# Patient Record
Sex: Female | Born: 1945
Health system: Southern US, Community
[De-identification: ages and names within clinical notes are randomized; demographics above are authoritative.]

## PROBLEM LIST (undated history)

## (undated) DIAGNOSIS — I10 Essential (primary) hypertension: Secondary | ICD-10-CM

## (undated) DIAGNOSIS — H409 Unspecified glaucoma: Secondary | ICD-10-CM

## (undated) HISTORY — DX: Unspecified glaucoma: H40.9

## (undated) HISTORY — PX: CATARACT EXTRACTION, BILATERAL: SHX1313

## (undated) HISTORY — PX: EYE SURGERY: SHX253

---

## 2010-06-10 ENCOUNTER — Emergency Department: Payer: Self-pay | Admitting: Emergency Medicine

## 2013-04-01 ENCOUNTER — Ambulatory Visit: Payer: Self-pay | Admitting: Family Medicine

## 2014-10-29 ENCOUNTER — Emergency Department: Payer: Self-pay | Admitting: Emergency Medicine

## 2014-10-29 LAB — CBC WITH DIFFERENTIAL/PLATELET
BASOS ABS: 0 10*3/uL (ref 0.0–0.1)
BASOS PCT: 0.9 %
Eosinophil #: 0.1 10*3/uL (ref 0.0–0.7)
Eosinophil %: 1.4 %
HCT: 39 % (ref 35.0–47.0)
HGB: 13.5 g/dL (ref 12.0–16.0)
Lymphocyte #: 1.6 10*3/uL (ref 1.0–3.6)
Lymphocyte %: 31 %
MCH: 29.2 pg (ref 26.0–34.0)
MCHC: 34.6 g/dL (ref 32.0–36.0)
MCV: 85 fL (ref 80–100)
MONOS PCT: 4.6 %
Monocyte #: 0.2 x10 3/mm (ref 0.2–0.9)
Neutrophil #: 3.1 10*3/uL (ref 1.4–6.5)
Neutrophil %: 62.1 %
PLATELETS: 138 10*3/uL — AB (ref 150–440)
RBC: 4.61 10*6/uL (ref 3.80–5.20)
RDW: 13.7 % (ref 11.5–14.5)
WBC: 5 10*3/uL (ref 3.6–11.0)

## 2014-10-29 LAB — URINALYSIS, COMPLETE
BILIRUBIN, UR: NEGATIVE
Bacteria: NONE SEEN
Blood: NEGATIVE
GLUCOSE, UR: NEGATIVE mg/dL (ref 0–75)
KETONE: NEGATIVE
LEUKOCYTE ESTERASE: NEGATIVE
NITRITE: NEGATIVE
PH: 8 (ref 4.5–8.0)
PROTEIN: NEGATIVE
RBC,UR: 3 /HPF (ref 0–5)
SPECIFIC GRAVITY: 1.014 (ref 1.003–1.030)
Squamous Epithelial: 1
WBC UR: 1 /HPF (ref 0–5)

## 2014-10-29 LAB — BASIC METABOLIC PANEL
ANION GAP: 7 (ref 7–16)
BUN: 12 mg/dL (ref 7–18)
CALCIUM: 8.4 mg/dL — AB (ref 8.5–10.1)
Chloride: 108 mmol/L — ABNORMAL HIGH (ref 98–107)
Co2: 27 mmol/L (ref 21–32)
Creatinine: 0.77 mg/dL (ref 0.60–1.30)
EGFR (African American): >60
GLUCOSE: 121 mg/dL — AB (ref 65–99)
Osmolality: 284 (ref 275–301)
Potassium: 3.9 mmol/L (ref 3.5–5.1)
SODIUM: 142 mmol/L (ref 136–145)

## 2014-10-29 LAB — TROPONIN I

## 2014-11-04 ENCOUNTER — Ambulatory Visit: Payer: Self-pay

## 2015-04-14 ENCOUNTER — Encounter: Payer: Self-pay | Admitting: Family Medicine

## 2015-04-14 DIAGNOSIS — R7303 Prediabetes: Secondary | ICD-10-CM | POA: Insufficient documentation

## 2015-04-14 DIAGNOSIS — E669 Obesity, unspecified: Secondary | ICD-10-CM | POA: Insufficient documentation

## 2015-04-14 DIAGNOSIS — I10 Essential (primary) hypertension: Secondary | ICD-10-CM | POA: Insufficient documentation

## 2015-11-30 ENCOUNTER — Other Ambulatory Visit: Payer: Self-pay | Admitting: Family Medicine

## 2015-11-30 MED ORDER — LISINOPRIL 5 MG PO TABS: 5.0000 mg | ORAL_TABLET | Freq: Every day | ORAL | Status: DC

## 2016-04-04 DIAGNOSIS — H25813 Combined forms of age-related cataract, bilateral: Secondary | ICD-10-CM | POA: Diagnosis not present

## 2016-07-26 DIAGNOSIS — H25813 Combined forms of age-related cataract, bilateral: Secondary | ICD-10-CM | POA: Diagnosis not present

## 2016-07-26 DIAGNOSIS — H401131 Primary open-angle glaucoma, bilateral, mild stage: Secondary | ICD-10-CM | POA: Diagnosis not present

## 2016-08-06 ENCOUNTER — Emergency Department
Admission: EM | Admit: 2016-08-06 | Discharge: 2016-08-06 | Disposition: A | Discharge status: Home / Self Care | Payer: BLUE CROSS/BLUE SHIELD | Attending: Emergency Medicine | Admitting: Emergency Medicine

## 2016-08-06 ENCOUNTER — Encounter: Payer: Self-pay | Admitting: Medical Oncology

## 2016-08-06 ENCOUNTER — Emergency Department: Payer: BLUE CROSS/BLUE SHIELD

## 2016-08-06 DIAGNOSIS — R42 Dizziness and giddiness: Secondary | ICD-10-CM | POA: Diagnosis present

## 2016-08-06 DIAGNOSIS — I10 Essential (primary) hypertension: Secondary | ICD-10-CM | POA: Diagnosis not present

## 2016-08-06 DIAGNOSIS — R55 Syncope and collapse: Secondary | ICD-10-CM

## 2016-08-06 HISTORY — DX: Essential (primary) hypertension: I10

## 2016-08-06 LAB — URINALYSIS COMPLETE WITH MICROSCOPIC (ARMC ONLY)
BILIRUBIN URINE: NEGATIVE
Glucose, UA: NEGATIVE mg/dL
HGB URINE DIPSTICK: NEGATIVE
Ketones, ur: NEGATIVE mg/dL
NITRITE: NEGATIVE
PH: 7 (ref 5.0–8.0)
Protein, ur: NEGATIVE mg/dL
RBC / HPF: NONE SEEN RBC/hpf (ref 0–5)
Specific Gravity, Urine: 1.01 (ref 1.005–1.030)

## 2016-08-06 LAB — CBC
HCT: 37.3 % (ref 35.0–47.0)
Hemoglobin: 13.2 g/dL (ref 12.0–16.0)
MCH: 29.4 pg (ref 26.0–34.0)
MCHC: 35.5 g/dL (ref 32.0–36.0)
MCV: 82.7 fL (ref 80.0–100.0)
Platelets: 137 10*3/uL — ABNORMAL LOW (ref 150–440)
RBC: 4.51 MIL/uL (ref 3.80–5.20)
RDW: 13.8 % (ref 11.5–14.5)
WBC: 5.1 10*3/uL (ref 3.6–11.0)

## 2016-08-06 LAB — BASIC METABOLIC PANEL
Anion gap: 7 (ref 5–15)
BUN: 17 mg/dL (ref 6–20)
CALCIUM: 9.3 mg/dL (ref 8.9–10.3)
CHLORIDE: 105 mmol/L (ref 101–111)
CO2: 27 mmol/L (ref 22–32)
CREATININE: 0.73 mg/dL (ref 0.44–1.00)
GFR calc Af Amer: >60 mL/min (ref >60)
GFR calc non Af Amer: >60 mL/min (ref >60)
GLUCOSE: 91 mg/dL (ref 65–99)
Potassium: 3.6 mmol/L (ref 3.5–5.1)
Sodium: 139 mmol/L (ref 135–145)

## 2016-08-06 LAB — TROPONIN I: Troponin I: <0.03 ng/mL (ref <0.03)

## 2016-08-06 NOTE — ED Notes (Signed)
Called lab to check on troponin results.  Lab informed me that they did not realize there was an add-on lab for this patient but they are pulling it now to run it.

## 2016-08-06 NOTE — ED Triage Notes (Addendum)
Pt reports she has been feeling dizzy since this am, pt reports she worked today until 1600. Around 1720 she was driving when she began having blurred vision and felt like her BP was elevated. Pt denies pain.  1810: while sitting in triage room pt began having some left hand numbness which immediately resolved. Orders placed for CT.

## 2016-08-06 NOTE — ED Provider Notes (Signed)
Time Seen: Approximately 1845  I have reviewed the triage notes  Chief Complaint: Hypertension   History of Present Illness: Catherine Pena is a 70 y.o. female *who states that she was talking to one driver and was driving and had sudden onset of feeling lightheaded dizziness. She describes some blurred vision without any visual field deficits. Patient felt that her blood pressure may be elevated and does have a history of hypertension. She states she feels improved at this point denies any chest pain, shortness of breath, trouble with speech or swallowing, bad headache, carbon monoxide exposure, or any other symptoms at this time. She had some brief episode of some numbness in her left hand in the triage. No weakness   Past Medical History:  Diagnosis Date  . Hypertension     Patient Active Problem List   Diagnosis Date Noted  . Benign essential HTN 04/14/2015  . Adiposity 04/14/2015  . Borderline diabetes 04/14/2015    History reviewed. No pertinent surgical history.  History reviewed. No pertinent surgical history.  Current Outpatient Rx  . Order #: [id number] Class: Normal    Allergies:  Review of patient's allergies indicates no known allergies.  Family History: No family history on file.  Social History: Social History  Substance Use Topics  . Smoking status: Not on file  . Smokeless tobacco: Not on file  . Alcohol use Not on file     Review of Systems:   10 point review of systems was performed and was otherwise negative:  Constitutional: No fever Eyes: No visual disturbances ENT: No sore throat, ear pain Cardiac: No chest pain Respiratory: No shortness of breath, wheezing, or stridor Abdomen: No abdominal pain, no vomiting, No diarrhea Endocrine: No weight loss, No night sweats Extremities: No peripheral edema, cyanosis Skin: No rashes, easy bruising Neurologic: No focal weakness, trouble with speech or swollowing Urologic: No dysuria, Hematuria,  or urinary frequency   Physical Exam:  ED Triage Vitals [08/06/16 1803]  Enc Vitals Group     BP (!) 163/84     Pulse Rate 62     Resp 17     Temp 98.1 F (36.7 C)     Temp Source Oral     SpO2 97 %     Weight 174 lb (78.9 kg)     Height 5\' 5"  (1.651 m)     Head Circumference      Peak Flow      Pain Score      Pain Loc      Pain Edu?      Excl. in GC?     General: Awake , Alert , and Oriented times 3; GCS 15 Head: Normal cephalic , atraumatic Eyes: Pupils equal , round, reactive to light Nose/Throat: No nasal drainage, patent upper airway without erythema or exudate.  Neck: Supple, Full range of motion, No anterior adenopathy or palpable thyroid masses Lungs: Clear to ascultation without wheezes , rhonchi, or rales Heart: Regular rate, regular rhythm without murmurs , gallops , or rubs Abdomen: Soft, non tender without rebound, guarding , or rigidity; bowel sounds positive and symmetric in all 4 quadrants. No organomegaly .        Extremities: 2 plus symmetric pulses. No edema, clubbing or cyanosis Neurologic: normal ambulation, Motor symmetric without deficits, sensory intact Skin: warm, dry, no rashes   Labs:   All laboratory work was reviewed including any pertinent negatives or positives listed below:  Labs Reviewed  CBC - Abnormal; Notable  for the following:       Result Value   Platelets 137 (*)    All other components within normal limits  URINALYSIS COMPLETEWITH MICROSCOPIC ([facility] ONLY) - Abnormal; Notable for the following:    Color, Urine STRAW (*)    APPearance CLEAR (*)    Leukocytes, UA TRACE (*)    Bacteria, UA RARE (*)    Squamous Epithelial / LPF 0-5 (*)    All other components within normal limits  BASIC METABOLIC PANEL  TROPONIN I  CBG MONITORING, ED  Laboratory work was reviewed and showed no clinically significant abnormalities.   EKG: ED ECG REPORT I, Jennye MoccasinBrian S Quigley, the attending physician, personally viewed and interpreted this  ECG.  Date: 08/06/2016 EKG Time: 1810 Rate: *58 Rhythm: normal sinus rhythm QRS Axis: normal Intervals: normal ST/T Wave abnormalities: Nonspecific ST and T wave abnormality Conduction Disturbances: none Narrative Interpretation: unremarkable No acute ischemic changes   Radiology: "Ct Head Wo Contrast  Result Date: 08/06/2016 CLINICAL DATA:  Acute onset of near-syncope. Numbness and tingling at the left arm. Initial encounter. EXAM: CT HEAD WITHOUT CONTRAST TECHNIQUE: Contiguous axial images were obtained from the base of the skull through the vertex without intravenous contrast. COMPARISON:  None. FINDINGS: There is no evidence of acute infarction, mass lesion, or intra- or extra-axial hemorrhage on CT. The posterior fossa, including the cerebellum, brainstem and fourth ventricle, is within normal limits. The third and lateral ventricles, and basal ganglia are unremarkable in appearance. The cerebral hemispheres are symmetric in appearance, with normal gray-white differentiation. No mass effect or midline shift is seen. There is no evidence of fracture; visualized osseous structures are unremarkable in appearance. The visualized portions of the orbits are within normal limits. The paranasal sinuses and mastoid air cells are well-aerated. No significant soft tissue abnormalities are seen. IMPRESSION: Unremarkable noncontrast CT of the head. Electronically Signed   By: Roanna RaiderJeffery  Chang M.D.   On: 08/06/2016 19:06  "  I personally reviewed the radiologic studies    ED Course:  Patient's blood pressure is elevated but not at a critical level of felt that was causing her significant symptoms at this time. The patient had a near syncopal episode but did not have any loss of consciousness and did not seem to have any chest pain, pulmonary emboli risk factors, etc. Given her negative neurologic exam and a normal head CT I felt this was unlikely to be a transient ischemic attack   Clinical Course      Assessment:  Near syncope   Final Clinical Impression: *  Final diagnoses:  Near syncope     Plan: * Outpatient Patient was advised to return immediately if condition worsens. Patient was advised to follow up with their primary care physician or other specialized physicians involved in their outpatient care. The patient and/or family member/power of attorney had laboratory results reviewed at the bedside. All questions and concerns were addressed and appropriate discharge instructions were distributed by the nursing staff.            Jennye MoccasinBrian S Quigley, MD 08/06/16 [phone]

## 2016-08-09 ENCOUNTER — Encounter: Payer: Self-pay | Admitting: Family Medicine

## 2016-08-09 ENCOUNTER — Ambulatory Visit (INDEPENDENT_AMBULATORY_CARE_PROVIDER_SITE_OTHER): Payer: BLUE CROSS/BLUE SHIELD | Admitting: Family Medicine

## 2016-08-09 VITALS — BP 122/82 | HR 78 | Ht 65.0 in | Wt 175.0 lb

## 2016-08-09 DIAGNOSIS — I10 Essential (primary) hypertension: Secondary | ICD-10-CM | POA: Diagnosis not present

## 2016-08-09 MED ORDER — LISINOPRIL 5 MG PO TABS
5.0000 mg | ORAL_TABLET | Freq: Every day | ORAL | 2 refills | Status: DC
Start: 2016-08-09 — End: 2017-09-03

## 2016-08-09 NOTE — Progress Notes (Signed)
Name: Darin EngelsJanice M Pena   MRN: [id number]    DOB: [birth year]   Date:08/09/2016       Progress Note  Subjective  Chief Complaint  Chief Complaint  Patient presents with  . Hypertension    yesterday it was 140/90    Hypertension  This is a chronic problem. The current episode started more than 1 year ago. The problem has been waxing and waning since onset. The problem is controlled. Pertinent negatives include no anxiety, blurred vision, chest pain, headaches, malaise/fatigue, neck pain, orthopnea, palpitations, peripheral edema, PND, shortness of breath or sweats. There are no associated agents to hypertension. There are no known risk factors for coronary artery disease. Past treatments include ACE inhibitors. The current treatment provides mild improvement. There are no compliance problems.  There is no history of angina, kidney disease, CAD/MI, CVA, heart failure, left ventricular hypertrophy, PVD, renovascular disease or retinopathy. There is no history of chronic renal disease or a hypertension causing med.    No problem-specific Assessment & Plan notes found for this encounter.   Past Medical History:  Diagnosis Date  . Hypertension     No past surgical history on file.  No family history on file.  Social History   Social History  . Marital status: Single    Spouse name: N/A  . Number of children: N/A  . Years of education: N/A   Occupational History  . Not on file.   Social History Main Topics  . Smoking status: Never Smoker  . Smokeless tobacco: Never Used  . Alcohol use Not on file  . Drug use: No  . Sexual activity: Not Currently   Other Topics Concern  . Not on file   Social History Narrative  . No narrative on file    No Known Allergies   Review of Systems  Constitutional: Negative for chills, fever, malaise/fatigue and weight loss.  HENT: Negative for ear discharge, ear pain and sore throat.   Eyes: Negative for blurred vision.  Respiratory: Negative  for cough, sputum production, shortness of breath and wheezing.   Cardiovascular: Negative for chest pain, palpitations, orthopnea, leg swelling and PND.  Gastrointestinal: Negative for abdominal pain, blood in stool, constipation, diarrhea, heartburn, melena and nausea.  Genitourinary: Negative for dysuria, frequency, hematuria and urgency.  Musculoskeletal: Negative for back pain, joint pain, myalgias and neck pain.  Skin: Negative for rash.  Neurological: Negative for dizziness, tingling, sensory change, focal weakness and headaches.  Endo/Heme/Allergies: Negative for environmental allergies and polydipsia. Does not bruise/bleed easily.  Psychiatric/Behavioral: Negative for depression and suicidal ideas. The patient is not nervous/anxious and does not have insomnia.      Objective  Vitals:   08/09/16 1438  BP: 122/82  Pulse: 78  Weight: 175 lb (79.4 kg)  Height: 5\' 5"  (1.651 m)    Physical Exam  Constitutional: She is well-developed, well-nourished, and in no distress. No distress.  HENT:  Head: Normocephalic and atraumatic.  Right Ear: External ear normal.  Left Ear: External ear normal.  Nose: Nose normal.  Mouth/Throat: Oropharynx is clear and moist.  Eyes: Conjunctivae and EOM are normal. Pupils are equal, round, and reactive to light. Right eye exhibits no discharge. Left eye exhibits no discharge.  Neck: Normal range of motion. Neck supple. No JVD present. No thyromegaly present.  Cardiovascular: Normal rate, regular rhythm, normal heart sounds and intact distal pulses.  Exam reveals no gallop and no friction rub.   No murmur heard. Pulmonary/Chest: Effort normal and breath  sounds normal.  Abdominal: Soft. Bowel sounds are normal. She exhibits no mass. There is no tenderness. There is no guarding.  Musculoskeletal: Normal range of motion. She exhibits no edema.  Lymphadenopathy:    She has no cervical adenopathy.  Neurological: She is alert. She has normal reflexes.   Skin: Skin is warm and dry. No rash noted. She is not diaphoretic. No erythema.  Psychiatric: Mood and affect normal.  Nursing note and vitals reviewed.     Assessment & Plan  Problem List Items Addressed This Visit      Cardiovascular and Mediastinum   Benign essential HTN - Primary   Relevant Medications   lisinopril (PRINIVIL,ZESTRIL) 5 MG tablet   Other Relevant Orders   Renal Function Panel    Other Visit Diagnoses   None.       Dr. Hayden Rasmusseneanna Jones [facility]  08/09/16

## 2016-08-10 LAB — RENAL FUNCTION PANEL
Albumin: 4.5 g/dL (ref 3.6–4.8)
BUN / CREAT RATIO: 16 (ref 12–28)
BUN: 13 mg/dL (ref 8–27)
CHLORIDE: 103 mmol/L (ref 96–106)
CO2: 24 mmol/L (ref 18–29)
Calcium: 9.9 mg/dL (ref 8.7–10.3)
Creatinine, Ser: 0.82 mg/dL (ref 0.57–1.00)
GFR calc non Af Amer: 74 mL/min/{1.73_m2} (ref >59)
GFR, EST AFRICAN AMERICAN: 85 mL/min/{1.73_m2} (ref >59)
GLUCOSE: 86 mg/dL (ref 65–99)
Phosphorus: 4 mg/dL (ref 2.5–4.5)
Potassium: 4.3 mmol/L (ref 3.5–5.2)
Sodium: 143 mmol/L (ref 134–144)

## 2016-10-05 DIAGNOSIS — H18413 Arcus senilis, bilateral: Secondary | ICD-10-CM | POA: Diagnosis not present

## 2016-10-05 DIAGNOSIS — H02839 Dermatochalasis of unspecified eye, unspecified eyelid: Secondary | ICD-10-CM | POA: Diagnosis not present

## 2016-10-05 DIAGNOSIS — H2511 Age-related nuclear cataract, right eye: Secondary | ICD-10-CM | POA: Diagnosis not present

## 2016-10-05 DIAGNOSIS — H2513 Age-related nuclear cataract, bilateral: Secondary | ICD-10-CM | POA: Diagnosis not present

## 2016-10-25 DIAGNOSIS — H25813 Combined forms of age-related cataract, bilateral: Secondary | ICD-10-CM | POA: Diagnosis not present

## 2016-10-25 DIAGNOSIS — H401131 Primary open-angle glaucoma, bilateral, mild stage: Secondary | ICD-10-CM | POA: Diagnosis not present

## 2016-11-05 DIAGNOSIS — H2511 Age-related nuclear cataract, right eye: Secondary | ICD-10-CM | POA: Diagnosis not present

## 2016-11-06 DIAGNOSIS — H2512 Age-related nuclear cataract, left eye: Secondary | ICD-10-CM | POA: Diagnosis not present

## 2016-11-13 DIAGNOSIS — H5211 Myopia, right eye: Secondary | ICD-10-CM | POA: Diagnosis not present

## 2016-11-26 DIAGNOSIS — H401131 Primary open-angle glaucoma, bilateral, mild stage: Secondary | ICD-10-CM | POA: Diagnosis not present

## 2016-11-26 DIAGNOSIS — H2512 Age-related nuclear cataract, left eye: Secondary | ICD-10-CM | POA: Diagnosis not present

## 2016-11-26 DIAGNOSIS — H25012 Cortical age-related cataract, left eye: Secondary | ICD-10-CM | POA: Diagnosis not present

## 2017-05-06 DIAGNOSIS — H401131 Primary open-angle glaucoma, bilateral, mild stage: Secondary | ICD-10-CM | POA: Diagnosis not present

## 2017-05-06 DIAGNOSIS — Z961 Presence of intraocular lens: Secondary | ICD-10-CM | POA: Diagnosis not present

## 2017-05-06 DIAGNOSIS — H47233 Glaucomatous optic atrophy, bilateral: Secondary | ICD-10-CM | POA: Diagnosis not present

## 2017-08-07 DIAGNOSIS — H401131 Primary open-angle glaucoma, bilateral, mild stage: Secondary | ICD-10-CM | POA: Diagnosis not present

## 2017-09-03 ENCOUNTER — Other Ambulatory Visit: Payer: Self-pay | Admitting: Family Medicine

## 2017-09-14 ENCOUNTER — Other Ambulatory Visit: Payer: Self-pay | Admitting: Family Medicine

## 2017-10-01 ENCOUNTER — Other Ambulatory Visit: Payer: Self-pay | Admitting: Family Medicine

## 2017-10-24 ENCOUNTER — Ambulatory Visit (INDEPENDENT_AMBULATORY_CARE_PROVIDER_SITE_OTHER): Payer: BLUE CROSS/BLUE SHIELD | Admitting: Family Medicine

## 2017-10-24 ENCOUNTER — Encounter: Payer: Self-pay | Admitting: Family Medicine

## 2017-10-24 VITALS — BP 120/88 | HR 64 | Ht 65.0 in | Wt 186.0 lb

## 2017-10-24 DIAGNOSIS — E6609 Other obesity due to excess calories: Secondary | ICD-10-CM | POA: Diagnosis not present

## 2017-10-24 DIAGNOSIS — Z6830 Body mass index (BMI) 30.0-30.9, adult: Secondary | ICD-10-CM

## 2017-10-24 DIAGNOSIS — I10 Essential (primary) hypertension: Secondary | ICD-10-CM

## 2017-10-24 MED ORDER — LISINOPRIL 5 MG PO TABS: ORAL_TABLET | ORAL | 2 refills | Status: DC

## 2017-10-24 NOTE — Progress Notes (Signed)
Name: Catherine EngelsJanice M Pena   MRN: [id number]    DOB: [birth year]   Date:10/24/2017       Progress Note  Subjective  Chief Complaint  Chief Complaint  Patient presents with  . Hypertension    Hypertension  This is a chronic problem. The current episode started more than 1 year ago. The problem is unchanged. The problem is controlled. Pertinent negatives include no anxiety, blurred vision, chest pain, headaches, malaise/fatigue, neck pain, orthopnea, palpitations, peripheral edema, PND, shortness of breath or sweats. There are no associated agents to hypertension. Past treatments include ACE inhibitors. The current treatment provides moderate improvement. There are no compliance problems.  There is no history of angina, kidney disease, CAD/MI, CVA, heart failure, left ventricular hypertrophy, PVD or retinopathy. There is no history of chronic renal disease, a hypertension causing med or renovascular disease.    No problem-specific Assessment & Plan notes found for this encounter.   Past Medical History:  Diagnosis Date  . Hypertension     No past surgical history on file.  No family history on file.  Social History   Social History  . Marital status: Single    Spouse name: N/A  . Number of children: N/A  . Years of education: N/A   Occupational History  . Not on file.   Social History Main Topics  . Smoking status: Never Smoker  . Smokeless tobacco: Never Used  . Alcohol use Not on file  . Drug use: No  . Sexual activity: Not Currently   Other Topics Concern  . Not on file   Social History Narrative  . No narrative on file    No Known Allergies  Outpatient Medications Prior to Visit  Medication Sig Dispense Refill  . lisinopril (PRINIVIL,ZESTRIL) 5 MG tablet TAKE 1 TABLET BY MOUTH EVERY DAY *NEED APPT* 3 tablet 0   No facility-administered medications prior to visit.     Review of Systems  Constitutional: Negative for chills, fever, malaise/fatigue and weight loss.   HENT: Negative for ear discharge, ear pain and sore throat.   Eyes: Negative for blurred vision.  Respiratory: Negative for cough, sputum production, shortness of breath and wheezing.   Cardiovascular: Negative for chest pain, palpitations, orthopnea, leg swelling and PND.  Gastrointestinal: Negative for abdominal pain, blood in stool, constipation, diarrhea, heartburn, melena and nausea.  Genitourinary: Negative for dysuria, frequency, hematuria and urgency.  Musculoskeletal: Negative for back pain, joint pain, myalgias and neck pain.  Skin: Negative for rash.  Neurological: Negative for dizziness, tingling, sensory change, focal weakness and headaches.  Endo/Heme/Allergies: Negative for environmental allergies and polydipsia. Does not bruise/bleed easily.  Psychiatric/Behavioral: Negative for depression and suicidal ideas. The patient is not nervous/anxious and does not have insomnia.      Objective  Vitals:   10/24/17 1057  BP: 120/88  Pulse: 64  Weight: 186 lb (84.4 kg)  Height: 5\' 5"  (1.651 m)    Physical Exam  Constitutional: She is well-developed, well-nourished, and in no distress. No distress.  HENT:  Head: Normocephalic and atraumatic.  Right Ear: External ear normal.  Left Ear: External ear normal.  Nose: Nose normal.  Mouth/Throat: Oropharynx is clear and moist.  Eyes: Pupils are equal, round, and reactive to light. Conjunctivae and EOM are normal. Right eye exhibits no discharge. Left eye exhibits no discharge.  Neck: Normal range of motion. Neck supple. No JVD present. No thyromegaly present.  Cardiovascular: Normal rate, regular rhythm, normal heart sounds and intact distal pulses.  Exam reveals no gallop and no friction rub.   No murmur heard. Pulmonary/Chest: Effort normal and breath sounds normal.  Abdominal: Soft. Bowel sounds are normal. She exhibits no mass. There is no tenderness. There is no guarding.  Musculoskeletal: Normal range of motion. She  exhibits no edema.  Lymphadenopathy:    She has no cervical adenopathy.  Neurological: She is alert.  Skin: Skin is warm and dry. She is not diaphoretic.  Psychiatric: Mood and affect normal.  Nursing note and vitals reviewed.     Assessment & Plan  Problem List Items Addressed This Visit      Cardiovascular and Mediastinum   Benign essential HTN - Primary   Relevant Medications   lisinopril (PRINIVIL,ZESTRIL) 5 MG tablet   Other Relevant Orders   Renal Function Panel     Other   Adiposity   Relevant Orders   Lipid Profile      Meds ordered this encounter  Medications  . lisinopril (PRINIVIL,ZESTRIL) 5 MG tablet    Sig: TAKE 1 TABLET BY MOUTH EVERY DAY *NEED APPT*    Dispense:  90 tablet    Refill:  2      Dr. Hayden [facility]  10/24/17

## 2017-10-24 NOTE — Patient Instructions (Signed)

## 2017-10-25 LAB — RENAL FUNCTION PANEL
ALBUMIN: 4.3 g/dL (ref 3.6–4.8)
BUN/Creatinine Ratio: 17 (ref 12–28)
BUN: 13 mg/dL (ref 8–27)
CALCIUM: 9.4 mg/dL (ref 8.7–10.3)
CHLORIDE: 101 mmol/L (ref 96–106)
CO2: 27 mmol/L (ref 20–29)
Creatinine, Ser: 0.76 mg/dL (ref 0.57–1.00)
GFR calc Af Amer: 93 mL/min/{1.73_m2} (ref >59)
GFR calc non Af Amer: 80 mL/min/{1.73_m2} (ref >59)
GLUCOSE: 85 mg/dL (ref 65–99)
PHOSPHORUS: 3.4 mg/dL (ref 2.5–4.5)
POTASSIUM: 4.5 mmol/L (ref 3.5–5.2)
SODIUM: 141 mmol/L (ref 134–144)

## 2017-10-25 LAB — LIPID PANEL
CHOL/HDL RATIO: 3.2 ratio (ref 0.0–4.4)
CHOLESTEROL TOTAL: 167 mg/dL (ref 100–199)
HDL: 52 mg/dL (ref >39)
LDL Calculated: 98 mg/dL (ref 0–99)
TRIGLYCERIDES: 83 mg/dL (ref 0–149)
VLDL Cholesterol Cal: 17 mg/dL (ref 5–40)

## 2017-11-07 ENCOUNTER — Ambulatory Visit (INDEPENDENT_AMBULATORY_CARE_PROVIDER_SITE_OTHER): Payer: BLUE CROSS/BLUE SHIELD

## 2017-11-07 VITALS — BP 142/70 | HR 77 | Temp 97.6°F | Resp 16 | Ht 65.0 in | Wt 185.6 lb

## 2017-11-07 DIAGNOSIS — Z1231 Encounter for screening mammogram for malignant neoplasm of breast: Secondary | ICD-10-CM

## 2017-11-07 DIAGNOSIS — E2839 Other primary ovarian failure: Secondary | ICD-10-CM

## 2017-11-07 DIAGNOSIS — Z1159 Encounter for screening for other viral diseases: Secondary | ICD-10-CM | POA: Diagnosis not present

## 2017-11-07 DIAGNOSIS — Z1239 Encounter for other screening for malignant neoplasm of breast: Secondary | ICD-10-CM

## 2017-11-07 DIAGNOSIS — Z0000 Encounter for general adult medical examination without abnormal findings: Secondary | ICD-10-CM | POA: Diagnosis not present

## 2017-11-07 NOTE — Patient Instructions (Signed)
[name] , Thank you for taking time to come for your Medicare Wellness Visit. I appreciate your ongoing commitment to your health goals. Please review the following plan we discussed and let me know if I can assist you in the future.   Screening recommendations/referrals: Colon Cancer Screening: Declined. Will discuss with Dr. Yetta BarreJones next year Mammogram: Ordered today. Please call [phone] to schedule your mammogram.  Bone Density: Ordered today. Our office will call you with your appointment date/time Recommended yearly ophthalmology/optometry visit for glaucoma screening and checkup Recommended yearly dental visit for hygiene and checkup  Vaccinations: Influenza vaccine: Up to date Pneumococcal vaccine: Declined. Please call your insurance company to determine your out of pocket expense. Tdap vaccine: Declined. Please call your insurance company to determine your out of pocket expense. Shingles vaccine: Declined. Please call your insurance company to determine your out of pocket expense.    Advanced directives: Advance directive discussed with you today. I have provided a copy for you to complete at home and have notarized. Once this is complete please bring a copy in to our office so we can scan it into your chart.  Conditions/risks identified: Try to increase exercise to 3 days per week, 45 minutes OR exercise 5 times per week for 30 minutes  Next appointment: Please schedule a follow up appointment with Dr. Yetta BarreJones   Please schedule your annual wellness exam with your Nurse Health Advisor in one year.  Preventive Care 1665 Years and Older, Female Preventive care refers to lifestyle choices and visits with your health care provider that can promote health and wellness. What does preventive care include?  A yearly physical exam. This is also called an annual well check.  Dental exams once or twice a year.  Routine eye exams. Ask your health care provider how often you should have  your eyes checked.  Personal lifestyle choices, including:  Daily care of your teeth and gums.  Regular physical activity.  Eating a healthy diet.  Avoiding tobacco and drug use.  Limiting alcohol use.  Practicing safe sex.  Taking low-dose aspirin every day.  Taking vitamin and mineral supplements as recommended by your health care provider. What happens during an annual well check? The services and screenings done by your health care provider during your annual well check will depend on your age, overall health, lifestyle risk factors, and family history of disease. Counseling  Your health care provider may ask you questions about your:  Alcohol use.  Tobacco use.  Drug use.  Emotional well-being.  Home and relationship well-being.  Sexual activity.  Eating habits.  History of falls.  Memory and ability to understand (cognition).  Work and work Astronomerenvironment.  Reproductive health. Screening  You may have the following tests or measurements:  Height, weight, and BMI.  Blood pressure.  Lipid and cholesterol levels. These may be checked every 5 years, or more frequently if you are over 69 years old.  Skin check.  Lung cancer screening. You may have this screening every year starting at age 69 if you have a 30-pack-year history of smoking and currently smoke or have quit within the past 15 years.  Fecal occult blood test (FOBT) of the stool. You may have this test every year starting at age 69.  Flexible sigmoidoscopy or colonoscopy. You may have a sigmoidoscopy every 5 years or a colonoscopy every 10 years starting at age 69.  Hepatitis C blood test.  Hepatitis B blood test.  Sexually transmitted disease (STD) testing.  Diabetes screening. This is done by checking your blood sugar (glucose) after you have not eaten for a while (fasting). You may have this done every 1-3 years.  Bone density scan. This is done to screen for osteoporosis. You may have  this done starting at age 87.  Mammogram. This may be done every 1-2 years. Talk to your health care provider about how often you should have regular mammograms. Talk with your health care provider about your test results, treatment options, and if necessary, the need for more tests. Vaccines  Your health care provider may recommend certain vaccines, such as:  Influenza vaccine. This is recommended every year.  Tetanus, diphtheria, and acellular pertussis (Tdap, Td) vaccine. You may need a Td booster every 10 years.  Zoster vaccine. You may need this after age 38.  Pneumococcal 13-valent conjugate (PCV13) vaccine. One dose is recommended after age 34.  Pneumococcal polysaccharide (PPSV23) vaccine. One dose is recommended after age 32. Talk to your health care provider about which screenings and vaccines you need and how often you need them. This information is not intended to replace advice given to you by your health care provider. Make sure you discuss any questions you have with your health care provider. Document Released: 01/06/2016 Document Revised: 08/29/2016 Document Reviewed: 10/11/2015 Elsevier Interactive Patient Education  2017 [place] Prevention in the Home Falls can cause injuries. They can happen to people of all ages. There are many things you can do to make your home safe and to help prevent falls. What can I do on the outside of my home?  Regularly fix the edges of walkways and driveways and fix any cracks.  Remove anything that might make you trip as you walk through a door, such as a raised step or threshold.  Trim any bushes or trees on the path to your home.  Use bright outdoor lighting.  Clear any walking paths of anything that might make someone trip, such as rocks or tools.  Regularly check to see if handrails are loose or broken. Make sure that both sides of any steps have handrails.  Any raised decks and porches should have guardrails on  the edges.  Have any leaves, snow, or ice cleared regularly.  Use sand or salt on walking paths during winter.  Clean up any spills in your garage right away. This includes oil or grease spills. What can I do in the bathroom?  Use night lights.  Install grab bars by the toilet and in the tub and shower. Do not use towel bars as grab bars.  Use non-skid mats or decals in the tub or shower.  If you need to sit down in the shower, use a plastic, non-slip stool.  Keep the floor dry. Clean up any water that spills on the floor as soon as it happens.  Remove soap buildup in the tub or shower regularly.  Attach bath mats securely with double-sided non-slip rug tape.  Do not have throw rugs and other things on the floor that can make you trip. What can I do in the bedroom?  Use night lights.  Make sure that you have a light by your bed that is easy to reach.  Do not use any sheets or blankets that are too big for your bed. They should not hang down onto the floor.  Have a firm chair that has side arms. You can use this for support while you get dressed.  Do not have throw  rugs and other things on the floor that can make you trip. What can I do in the kitchen?  Clean up any spills right away.  Avoid walking on wet floors.  Keep items that you use a lot in easy-to-reach places.  If you need to reach something above you, use a strong step stool that has a grab bar.  Keep electrical cords out of the way.  Do not use floor polish or wax that makes floors slippery. If you must use wax, use non-skid floor wax.  Do not have throw rugs and other things on the floor that can make you trip. What can I do with my stairs?  Do not leave any items on the stairs.  Make sure that there are handrails on both sides of the stairs and use them. Fix handrails that are broken or loose. Make sure that handrails are as long as the stairways.  Check any carpeting to make sure that it is firmly  attached to the stairs. Fix any carpet that is loose or worn.  Avoid having throw rugs at the top or bottom of the stairs. If you do have throw rugs, attach them to the floor with carpet tape.  Make sure that you have a light switch at the top of the stairs and the bottom of the stairs. If you do not have them, ask someone to add them for you. What else can I do to help prevent falls?  Wear shoes that:  Do not have high heels.  Have rubber bottoms.  Are comfortable and fit you well.  Are closed at the toe. Do not wear sandals.  If you use a stepladder:  Make sure that it is fully opened. Do not climb a closed stepladder.  Make sure that both sides of the stepladder are locked into place.  Ask someone to hold it for you, if possible.  Clearly mark and make sure that you can see:  Any grab bars or handrails.  First and last steps.  Where the edge of each step is.  Use tools that help you move around (mobility aids) if they are needed. These include:  Canes.  Walkers.  Scooters.  Crutches.  Turn on the lights when you go into a dark area. Replace any light bulbs as soon as they burn out.  Set up your furniture so you have a clear path. Avoid moving your furniture around.  If any of your floors are uneven, fix them.  If there are any pets around you, be aware of where they are.  Review your medicines with your doctor. Some medicines can make you feel dizzy. This can increase your chance of falling. Ask your doctor what other things that you can do to help prevent falls. This information is not intended to replace advice given to you by your health care provider. Make sure you discuss any questions you have with your health care provider. Document Released: 10/06/2009 Document Revised: 05/17/2016 Document Reviewed: 01/14/2015 Elsevier Interactive Patient Education  2017 [employer]

## 2017-11-07 NOTE — Progress Notes (Signed)
Subjective:   Catherine EngelsJanice M Pena is a 71 y.o. female who presents for Medicare Annual (Subsequent) preventive examination.  Review of Systems:  N/A Cardiac Risk Factors include: advanced age (>2155men, 21>65 women);hypertension;obesity (BMI >30kg/m2)     Objective:     Vitals: BP (!) 142/70 (BP Location: Right Arm, Patient Position: Sitting, Cuff Size: Large)   Pulse 77   Temp 97.6 F (36.4 C) (Oral)   Resp 16   Ht 5\' 5"  (1.651 m)   Wt 185 lb 9.6 oz (84.2 kg)   BMI 30.89 kg/m   Body mass index is 30.89 kg/m.   Tobacco Social History   Tobacco Use  Smoking Status Former Smoker  . Packs/day: 0.25  . Years: 20.00  . Pack years: 5.00  . Types: Cigarettes  Smokeless Tobacco Never Used     Counseling given: No   Past Medical History:  Diagnosis Date  . Hypertension    Past Surgical History:  Procedure Laterality Date  . CATARACT EXTRACTION, BILATERAL Bilateral 10/2016 and 11/2016   History reviewed. No pertinent family history. Social History   Substance and Sexual Activity  Sexual Activity Not Currently    Outpatient Encounter Medications as of 11/07/2017  Medication Sig  . latanoprost (XALATAN) 0.005 % ophthalmic solution Place 1 drop into both eyes at bedtime.  Marland Kitchen. lisinopril (PRINIVIL,ZESTRIL) 5 MG tablet TAKE 1 TABLET BY MOUTH EVERY DAY *NEED APPT*   No facility-administered encounter medications on file as of 11/07/2017.     Activities of Daily Living In your present state of health, do you have any difficulty performing the following activities: 11/07/2017  Hearing? N  Vision? N  Difficulty concentrating or making decisions? N  Walking or climbing stairs? N  Dressing or bathing? N  Doing errands, shopping? N  Preparing Food and eating ? N  Using the Toilet? N  In the past six months, have you accidently leaked urine? N  Do you have problems with loss of bowel control? N  Managing your Medications? N  Managing your Finances? N  Housekeeping or  managing your Housekeeping? N  Some recent data might be hidden    Patient Care Team: Duanne LimerickJones, Deanna C, MD as PCP - General (Family Medicine)    Assessment:     Exercise Activities and Dietary recommendations Current Exercise Habits: Home exercise routine, Type of exercise: walking;strength training/weights, Time (Minutes): 30, Frequency (Times/Week): 3, Weekly Exercise (Minutes/Week): 90, Intensity: Mild, Exercise limited by: None identified  Goals    None     Fall Risk Fall Risk  11/07/2017 08/09/2016  Falls in the past year? No No   Depression Screen PHQ 2/9 Scores 11/07/2017 10/24/2017 08/09/2016  PHQ - 2 Score 0 0 0  PHQ- 9 Score 0 1 -     Cognitive Function     6CIT Screen 11/07/2017  What Year? 0 points  What month? 0 points  What time? 0 points  Count back from 20 0 points  Months in reverse 0 points  Repeat phrase 0 points  Total Score 0    Immunization History  Administered Date(s) Administered  . Influenza-Unspecified 10/07/2017   Screening Tests Health Maintenance  Topic Date Due  . Hepatitis C Screening  [birth year]  . MAMMOGRAM  03/23/1998  . DEXA SCAN  03/23/2013  . COLONOSCOPY  12/24/2018 (Originally 03/23/1998)  . TETANUS/TDAP  12/24/2018 (Originally 03/24/1967)  . PNA vac Low Risk Adult (1 of 2 - PCV13) 12/24/2018 (Originally 03/23/2013)  . INFLUENZA VACCINE  Completed  Plan:   I have personally reviewed and addressed the Medicare Annual Wellness questionnaire and have noted the following in the patient's chart:  A. Medical and social history B. Use of alcohol, tobacco or illicit drugs  C. Current medications and supplements D. Functional ability and status E.  Nutritional status F.  Physical activity G. Advance directives H. List of other physicians I.  Hospitalizations, surgeries, and ER visits in previous 12 months J.  Vitals K. Screenings such as hearing and vision if needed, cognitive and depression L. Referrals and appointments  - none  In addition, I have reviewed and discussed with patient certain preventive protocols, quality metrics, and best practice recommendations. A written personalized care plan for preventive services as well as general preventive health recommendations were provided to patient.  See attached scanned questionnaire for additional information.   Signed,  Deon PillingAmmie Deriyah Kunath, LPN Nurse Health Advisor  Nurse Notes: Due for mammogram, bone density screening and Hep C screening. All of these screenings have been ordered today.   Due for colon cancer screening. Declined colonoscopy an FOBT. States she would prefer to discuss this with you at her next OV. Advised pt to schedule appt before leaving office today.  Due for Pneumococcal, Shingles and Tetanus vaccines. Declined today. Declined. Declined. Pt advised to call her insurance company to determine her out of pocket expense. Advised she may also receive these vaccines at her local pharmacy or Health Dept.

## 2017-11-11 ENCOUNTER — Ambulatory Visit: Payer: Medicare Other | Admitting: Family Medicine

## 2017-11-12 ENCOUNTER — Ambulatory Visit
Admission: RE | Admit: 2017-11-12 | Discharge: 2017-11-12 | Disposition: A | Discharge status: Home / Self Care | Payer: Medicare Other | Source: Ambulatory Visit | Attending: Family Medicine | Admitting: Family Medicine

## 2017-11-12 DIAGNOSIS — E2839 Other primary ovarian failure: Secondary | ICD-10-CM | POA: Diagnosis not present

## 2017-11-12 DIAGNOSIS — Z13820 Encounter for screening for osteoporosis: Secondary | ICD-10-CM | POA: Diagnosis not present

## 2017-11-13 DIAGNOSIS — H401131 Primary open-angle glaucoma, bilateral, mild stage: Secondary | ICD-10-CM | POA: Diagnosis not present

## 2018-01-12 ENCOUNTER — Other Ambulatory Visit: Payer: Self-pay

## 2018-01-12 ENCOUNTER — Encounter: Payer: Self-pay | Admitting: Gynecology

## 2018-01-12 ENCOUNTER — Ambulatory Visit
Admission: EM | Admit: 2018-01-12 | Discharge: 2018-01-12 | Disposition: A | Discharge status: Home / Self Care | Payer: BLUE CROSS/BLUE SHIELD | Attending: Family Medicine | Admitting: Family Medicine

## 2018-01-12 DIAGNOSIS — B9789 Other viral agents as the cause of diseases classified elsewhere: Secondary | ICD-10-CM

## 2018-01-12 DIAGNOSIS — R05 Cough: Secondary | ICD-10-CM | POA: Diagnosis not present

## 2018-01-12 DIAGNOSIS — J069 Acute upper respiratory infection, unspecified: Secondary | ICD-10-CM

## 2018-01-12 MED ORDER — HYDROCOD POLST-CPM POLST ER 10-8 MG/5ML PO SUER: 5.0000 mL | Freq: Every evening | ORAL | 0 refills | Status: DC | PRN

## 2018-01-12 NOTE — ED Provider Notes (Signed)
[facility]    CSN: [id number] Arrival date & time: 01/12/18  1354     History   Chief Complaint Chief Complaint  Patient presents with  . Sinusitis    HPI FLOREEN TEEGARDEN is a 72 y.o. female.   The history is provided by the patient.  URI  Presenting symptoms: congestion, cough and rhinorrhea   Severity:  Moderate Onset quality:  Sudden Duration:  3 days Timing:  Constant Progression:  Unchanged Chronicity:  New Relieved by:  None tried Ineffective treatments:  None tried Associated symptoms: no sinus pain and no wheezing   Risk factors: being elderly   Risk factors: no chronic cardiac disease, no chronic kidney disease, no chronic respiratory disease, no diabetes mellitus, no immunosuppression, no recent illness, no recent travel and no sick contacts     Past Medical History:  Diagnosis Date  . Hypertension     Patient Active Problem List   Diagnosis Date Noted  . Benign essential HTN 04/14/2015  . Adiposity 04/14/2015  . Borderline diabetes 04/14/2015    Past Surgical History:  Procedure Laterality Date  . CATARACT EXTRACTION, BILATERAL Bilateral 10/2016 and 11/2016  . EYE SURGERY      OB History    No data available       Home Medications    Prior to Admission medications   Medication Sig Start Date End Date Taking? Authorizing Provider  latanoprost (XALATAN) 0.005 % ophthalmic solution Place 1 drop into both eyes at bedtime. 09/30/17  Yes [provider]  lisinopril (PRINIVIL,ZESTRIL) 5 MG tablet TAKE 1 TABLET BY MOUTH EVERY DAY *NEED APPT* 10/24/17  Yes Duanne Limerick, MD  chlorpheniramine-HYDROcodone (TUSSIONEX PENNKINETIC ER) 10-8 MG/5ML SUER Take 5 mLs by mouth at bedtime as needed. 01/12/18   Payton Mccallum, MD    Family History Family History  Problem Relation Age of Onset  . Cancer Mother   . Diabetes Father     Social History Social History   Tobacco Use  . Smoking status: Former Smoker    Packs/day: 0.25      Years: 20.00    Pack years: 5.00    Types: Cigarettes  . Smokeless tobacco: Never Used  Substance Use Topics  . Alcohol use: No    Frequency: Never  . Drug use: No     Allergies   Patient has no known allergies.   Review of Systems Review of Systems  HENT: Positive for congestion and rhinorrhea. Negative for sinus pain.   Respiratory: Positive for cough. Negative for wheezing.      Physical Exam Triage Vital Signs ED Triage Vitals  Enc Vitals Group     BP 01/12/18 1458 123/77     Pulse Rate 01/12/18 1458 63     Resp 01/12/18 1458 16     Temp 01/12/18 1458 98.6 F (37 C)     Temp Source 01/12/18 1458 Oral     SpO2 01/12/18 1458 98 %     Weight 01/12/18 1456 178 lb (80.7 kg)     Height 01/12/18 1456 5\' 4"  (1.626 m)     Head Circumference --      Peak Flow --      Pain Score 01/12/18 1456 8     Pain Loc --      Pain Edu? --      Excl. in GC? --    No data found.  Updated Vital Signs BP 123/77 (BP Location: Left Arm)   Pulse 63  Temp 98.6 F (37 C) (Oral)   Resp 16   Ht 5\' 4"  (1.626 m)   Wt 178 lb (80.7 kg)   SpO2 98%   BMI 30.55 kg/m   Visual Acuity Right Eye Distance:   Left Eye Distance:   Bilateral Distance:    Right Eye Near:   Left Eye Near:    Bilateral Near:     Physical Exam  Constitutional: She appears well-developed and well-nourished. No distress.  HENT:  Head: Normocephalic and atraumatic.  Right Ear: Tympanic membrane, external ear and ear canal normal.  Left Ear: Tympanic membrane, external ear and ear canal normal.  Nose: No mucosal edema, rhinorrhea, nose lacerations, sinus tenderness, nasal deformity, septal deviation or nasal septal hematoma. No epistaxis.  No foreign bodies. Right sinus exhibits no maxillary sinus tenderness and no frontal sinus tenderness. Left sinus exhibits no maxillary sinus tenderness and no frontal sinus tenderness.  Mouth/Throat: Uvula is midline and mucous membranes are normal. Posterior  oropharyngeal erythema present. No oropharyngeal exudate, posterior oropharyngeal edema or tonsillar abscesses. No tonsillar exudate.  Eyes: Conjunctivae and EOM are normal. Pupils are equal, round, and reactive to light. Right eye exhibits no discharge. Left eye exhibits no discharge. No scleral icterus.  Neck: Normal range of motion. Neck supple. No thyromegaly present.  Cardiovascular: Normal rate, regular rhythm and normal heart sounds.  Pulmonary/Chest: Effort normal and breath sounds normal. No stridor. No respiratory distress. She has no wheezes. She has no rales.  Lymphadenopathy:    She has no cervical adenopathy.  Skin: She is not diaphoretic.  Nursing note and vitals reviewed.    UC Treatments / Results  Labs (all labs ordered are listed, but only abnormal results are displayed) Labs Reviewed - No data to display  EKG  EKG Interpretation None       Radiology No results found.  Procedures Procedures (including critical care time)  Medications Ordered in UC Medications - No data to display   Initial Impression / Assessment and Plan / UC Course  I have reviewed the triage vital signs and the nursing notes.  Pertinent labs & imaging results that were available during my care of the patient were reviewed by me and considered in my medical decision making (see chart for details).       Final Clinical Impressions(s) / UC Diagnoses   Final diagnoses:  Viral URI with cough    ED Discharge Orders        Ordered    chlorpheniramine-HYDROcodone (TUSSIONEX PENNKINETIC ER) 10-8 MG/5ML SUER  At bedtime PRN     01/12/18 1521     1. diagnosis reviewed with patient 2. rx as per orders above; reviewed possible side effects, interactions, risks and benefits  3. Recommend supportive treatment with rest, fluids, otc analgesics 4. Follow-up prn if symptoms worsen or don't improve  Controlled Substance Prescriptions [place] Controlled Substance Registry consulted? Not  Applicable   Payton Mccallumonty, Dynesha Woolen, MD 01/12/18 [phone]

## 2018-01-12 NOTE — ED Triage Notes (Signed)
Per patient sinus drainage , headache/ cough x 3 days.

## 2018-01-15 ENCOUNTER — Telehealth: Payer: Self-pay

## 2018-01-15 NOTE — Telephone Encounter (Signed)
Called to follow up with patient since visit here at [facility]. Patient instructed to call back with any questions or concerns. MAH  

## 2018-02-19 DIAGNOSIS — H401131 Primary open-angle glaucoma, bilateral, mild stage: Secondary | ICD-10-CM | POA: Diagnosis not present

## 2018-05-10 ENCOUNTER — Other Ambulatory Visit: Payer: Self-pay | Admitting: Family Medicine

## 2018-05-10 DIAGNOSIS — I10 Essential (primary) hypertension: Secondary | ICD-10-CM

## 2018-05-21 DIAGNOSIS — H401131 Primary open-angle glaucoma, bilateral, mild stage: Secondary | ICD-10-CM | POA: Diagnosis not present

## 2018-05-21 DIAGNOSIS — H47233 Glaucomatous optic atrophy, bilateral: Secondary | ICD-10-CM | POA: Diagnosis not present

## 2018-05-21 DIAGNOSIS — Z961 Presence of intraocular lens: Secondary | ICD-10-CM | POA: Diagnosis not present

## 2018-05-28 ENCOUNTER — Encounter: Payer: Self-pay | Admitting: Family Medicine

## 2018-05-28 ENCOUNTER — Ambulatory Visit (INDEPENDENT_AMBULATORY_CARE_PROVIDER_SITE_OTHER): Payer: BLUE CROSS/BLUE SHIELD | Admitting: Family Medicine

## 2018-05-28 VITALS — BP 134/82 | HR 64 | Ht 64.0 in | Wt 186.0 lb

## 2018-05-28 DIAGNOSIS — R7303 Prediabetes: Secondary | ICD-10-CM | POA: Diagnosis not present

## 2018-05-28 DIAGNOSIS — I10 Essential (primary) hypertension: Secondary | ICD-10-CM

## 2018-05-28 MED ORDER — LISINOPRIL 5 MG PO TABS: ORAL_TABLET | ORAL | 1 refills | Status: DC

## 2018-05-28 NOTE — Assessment & Plan Note (Signed)
Controlled on prinivil 5 mg q day. Will check renal panel

## 2018-05-28 NOTE — Assessment & Plan Note (Signed)
Patient with history of "borderline diabetes". Will evaluate A1C.

## 2018-05-28 NOTE — Patient Instructions (Signed)
Leg Cramps Leg cramps occur when a muscle or muscles tighten and you have no control over this tightening (involuntary muscle contraction). Muscle cramps can develop in any muscle, but the most common place is in the calf muscles of the leg. Those cramps can occur during exercise or when you are at rest. Leg cramps are painful, and they may last for a few seconds to a few minutes. Cramps may return several times before they finally stop. Usually, leg cramps are not caused by a serious medical problem. In many cases, the cause is not known. Some common causes include:  Overexertion.  Overuse from repetitive motions, or doing the same thing over and over.  Remaining in a certain position for a long period of time.  Improper preparation, form, or technique while performing a sport or an activity.  Dehydration.  Injury.  Side effects of some medicines.  Abnormally low levels of the salts and ions in your blood (electrolytes), especially potassium and calcium. These levels could be low if you are taking water pills (diuretics) or if you are pregnant.  Follow these instructions at home: Watch your condition for any changes. Taking the following actions may help to lessen any discomfort that you are feeling:  Stay well-hydrated. Drink enough fluid to keep your urine clear or pale yellow.  Try massaging, stretching, and relaxing the affected muscle. Do this for several minutes at a time.  For tight or tense muscles, use a warm towel, heating pad, or hot shower water directed to the affected area.  If you are sore or have pain after a cramp, applying ice to the affected area may relieve discomfort. ? Put ice in a plastic bag. ? Place a towel between your skin and the bag. ? Leave the ice on for 20 minutes, 2-3 times per day.  Avoid strenuous exercise for several days if you have been having frequent leg cramps.  Make sure that your diet includes the essential minerals for your muscles to  work normally.  Take medicines only as directed by your health care provider.  Contact a health care provider if:  Your leg cramps get more severe or more frequent, or they do not improve over time.  Your foot becomes cold, numb, or blue. This information is not intended to replace advice given to you by your health care provider. Make sure you discuss any questions you have with your health care provider. Document Released: 01/17/2005 Document Revised: 05/17/2016 Document Reviewed: 11/17/2014 Elsevier Interactive Patient Education  2018 [employer]  

## 2018-05-28 NOTE — Progress Notes (Signed)
Name: Catherine Pena   MRN: [id number]    DOB: 1946/07/06   Date:05/28/2018       Progress Note  Subjective  Chief Complaint  Chief Complaint  Patient presents with  . Hypertension    refill med  . leg cramps    bilateral in calves    Hypertension  This is a chronic problem. The current episode started more than 1 year ago. The problem is controlled. Pertinent negatives include no anxiety, blurred vision, chest pain, headaches, malaise/fatigue, neck pain, orthopnea, palpitations, peripheral edema, PND, shortness of breath or sweats. There are no associated agents to hypertension. There are no known risk factors for coronary artery disease. Past treatments include ACE inhibitors.    Benign essential HTN Controlled on prinivil 5 mg q day. Will check renal panel   Borderline diabetes Patient with history of "borderline diabetes". Will evaluate A1C.   Past Medical History:  Diagnosis Date  . Hypertension     Past Surgical History:  Procedure Laterality Date  . CATARACT EXTRACTION, BILATERAL Bilateral 10/2016 and 11/2016  . EYE SURGERY      Family History  Problem Relation Age of Onset  . Cancer Mother   . Diabetes Father     Social History   Socioeconomic History  . Marital status: Single    Spouse name: Not on file  . Number of children: 3  . Years of education: Not on file  . Highest education level: Not on file  Occupational History  . Occupation: Pensions consultant  . Financial resource strain: Not hard at all  . Food insecurity:    Worry: Never true    Inability: Never true  . Transportation needs:    Medical: No    Non-medical: No  Tobacco Use  . Smoking status: Former Smoker    Packs/day: 0.25    Years: 20.00    Pack years: 5.00    Types: Cigarettes  . Smokeless tobacco: Never Used  Substance and Sexual Activity  . Alcohol use: No    Frequency: Never  . Drug use: No  . Sexual activity: Not Currently  Lifestyle  . Physical activity:     Days per week: 3 days    Minutes per session: 30 min  . Stress: Not at all  Relationships  . Social connections:    Talks on phone: More than three times a week    Gets together: Twice a week    Attends religious service: More than 4 times per year    Active member of club or organization: No    Attends meetings of clubs or organizations: Never    Relationship status: Not on file  . Intimate partner violence:    Fear of current or ex partner: No    Emotionally abused: No    Physically abused: No    Forced sexual activity: No  Other Topics Concern  . Not on file  Social History Narrative  . Not on file    No Known Allergies  Outpatient Medications Prior to Visit  Medication Sig Dispense Refill  . latanoprost (XALATAN) 0.005 % ophthalmic solution Place 1 drop into both eyes at bedtime.  3  . lisinopril (PRINIVIL,ZESTRIL) 5 MG tablet TAKE 1 TABLET BY MOUTH EVERY DAY *NEED APPT* 30 tablet 0  . chlorpheniramine-HYDROcodone (TUSSIONEX PENNKINETIC ER) 10-8 MG/5ML SUER Take 5 mLs by mouth at bedtime as needed. 120 mL 0   No facility-administered medications prior to visit.     Review  of Systems  Constitutional: Negative for chills, fever, malaise/fatigue and weight loss.  HENT: Negative for ear discharge, ear pain and sore throat.   Eyes: Negative for blurred vision.  Respiratory: Negative for cough, sputum production, shortness of breath and wheezing.   Cardiovascular: Negative for chest pain, palpitations, orthopnea, leg swelling and PND.  Gastrointestinal: Negative for abdominal pain, blood in stool, constipation, diarrhea, heartburn, melena and nausea.  Genitourinary: Negative for dysuria, frequency, hematuria and urgency.  Musculoskeletal: Negative for back pain, joint pain, myalgias and neck pain.  Skin: Negative for rash.  Neurological: Negative for dizziness, tingling, sensory change, focal weakness and headaches.  Endo/Heme/Allergies: Negative for environmental allergies  and polydipsia. Does not bruise/bleed easily.  Psychiatric/Behavioral: Negative for depression and suicidal ideas. The patient is not nervous/anxious and does not have insomnia.      Objective  Vitals:   05/28/18 0912  BP: 134/82  Pulse: 64  Weight: 186 lb (84.4 kg)  Height: 5\' 4"  (1.626 m)    Physical Exam  Constitutional: No distress.  HENT:  Head: Normocephalic and atraumatic.  Right Ear: External ear normal.  Left Ear: External ear normal.  Nose: Nose normal.  Mouth/Throat: Oropharynx is clear and moist.  Eyes: Pupils are equal, round, and reactive to light. Conjunctivae and EOM are normal. Right eye exhibits no discharge. Left eye exhibits no discharge.  Neck: Normal range of motion. Neck supple. No JVD present. No thyromegaly present.  Cardiovascular: Normal rate, regular rhythm, S1 normal, S2 normal, normal heart sounds, intact distal pulses and normal pulses. PMI is not displaced. Exam reveals no gallop, no S3, no S4, no friction rub and no decreased pulses.  No murmur heard. Pulmonary/Chest: Effort normal and breath sounds normal.  Abdominal: Soft. Bowel sounds are normal. She exhibits no mass. There is no tenderness. There is no guarding.  Musculoskeletal: Normal range of motion. She exhibits no edema.  Lymphadenopathy:    She has no cervical adenopathy.  Neurological: She is alert. She has normal reflexes.  Skin: Skin is warm and dry. She is not diaphoretic.  Nursing note and vitals reviewed.     Assessment & Plan  Problem List Items Addressed This Visit      Cardiovascular and Mediastinum   Benign essential HTN - Primary    Controlled on prinivil 5 mg q day. Will check renal panel       Relevant Medications   lisinopril (PRINIVIL,ZESTRIL) 5 MG tablet   Other Relevant Orders   Renal Function Panel     Other   Borderline diabetes    Patient with history of "borderline diabetes". Will evaluate A1C.      Relevant Orders   Hemoglobin A1c      Meds  ordered this encounter  Medications  . lisinopril (PRINIVIL,ZESTRIL) 5 MG tablet    Sig: Once daily    Dispense:  90 tablet    Refill:  1      Dr. Hayden Rasmusseneanna Dayshon Roback [facility]  05/28/18

## 2018-08-20 DIAGNOSIS — Z961 Presence of intraocular lens: Secondary | ICD-10-CM | POA: Diagnosis not present

## 2018-08-20 DIAGNOSIS — H401131 Primary open-angle glaucoma, bilateral, mild stage: Secondary | ICD-10-CM | POA: Diagnosis not present

## 2018-11-10 ENCOUNTER — Ambulatory Visit (INDEPENDENT_AMBULATORY_CARE_PROVIDER_SITE_OTHER): Payer: BLUE CROSS/BLUE SHIELD

## 2018-11-10 VITALS — BP 132/84 | HR 64 | Temp 97.9°F | Resp 16 | Ht 64.0 in | Wt 194.6 lb

## 2018-11-10 DIAGNOSIS — Z1239 Encounter for other screening for malignant neoplasm of breast: Secondary | ICD-10-CM

## 2018-11-10 DIAGNOSIS — Z0000 Encounter for general adult medical examination without abnormal findings: Secondary | ICD-10-CM

## 2018-11-10 NOTE — Patient Instructions (Signed)
Catherine Pena , Thank you for taking time to come for your Medicare Wellness Visit. I appreciate your ongoing commitment to your health goals. Please review the following plan we discussed and let me know if I can assist you in the future.   Screening recommendations/referrals: Colonoscopy: due - please discuss at next appt Mammogram: due - please discuss at next appt. Once ordered please call [phone] to schedule your mammogram.  Bone Density: done 11/12/17 normal Recommended yearly ophthalmology/optometry visit for glaucoma screening and checkup Recommended yearly dental visit for hygiene and checkup  Vaccinations: Influenza vaccine: done October 2019; please bring a copy of this record to our office Pneumococcal vaccine: due Tdap vaccine: please check with health department for records Shingles vaccine: Shingrix discussed, please contact your insurance company for coverage information.    Advanced directives: Advance directive discussed with you today. I have provided a copy for you to complete at home and have notarized. Once this is complete please bring a copy in to our office so we can scan it into your chart.  Conditions/risks identified: Recommend increasing physical activity to 150 minutes per week. Sign up for Silver Sneakers program through [employer].   Next appointment: 12/02/18 Dr. Yetta BarreJones 10:20   Preventive Care 7465 Years and Older, Female Preventive care refers to lifestyle choices and visits with your health care provider that can promote health and wellness. What does preventive care include?  A yearly physical exam. This is also called an annual well check.  Dental exams once or twice a year.  Routine eye exams. Ask your health care provider how often you should have your eyes checked.  Personal lifestyle choices, including:  Daily care of your teeth and gums.  Regular physical activity.  Eating a healthy diet.  Avoiding tobacco and drug use.  Limiting  alcohol use.  Practicing safe sex.  Taking low-dose aspirin every day.  Taking vitamin and mineral supplements as recommended by your health care provider. What happens during an annual well check? The services and screenings done by your health care provider during your annual well check will depend on your age, overall health, lifestyle risk factors, and family history of disease. Counseling  Your health care provider may ask you questions about your:  Alcohol use.  Tobacco use.  Drug use.  Emotional well-being.  Home and relationship well-being.  Sexual activity.  Eating habits.  History of falls.  Memory and ability to understand (cognition).  Work and work Astronomerenvironment.  Reproductive health. Screening  You may have the following tests or measurements:  Height, weight, and BMI.  Blood pressure.  Lipid and cholesterol levels. These may be checked every 5 years, or more frequently if you are over 70 years old.  Skin check.  Lung cancer screening. You may have this screening every year starting at age 70 if you have a 30-pack-year history of smoking and currently smoke or have quit within the past 15 years.  Fecal occult blood test (FOBT) of the stool. You may have this test every year starting at age 70.  Flexible sigmoidoscopy or colonoscopy. You may have a sigmoidoscopy every 5 years or a colonoscopy every 10 years starting at age 70.  Hepatitis C blood test.  Hepatitis B blood test.  Sexually transmitted disease (STD) testing.  Diabetes screening. This is done by checking your blood sugar (glucose) after you have not eaten for a while (fasting). You may have this done every 1-3 years.  Bone density scan. This is done to  screen for osteoporosis. You may have this done starting at age 13.  Mammogram. This may be done every 1-2 years. Talk to your health care provider about how often you should have regular mammograms. Talk with your health care provider  about your test results, treatment options, and if necessary, the need for more tests. Vaccines  Your health care provider may recommend certain vaccines, such as:  Influenza vaccine. This is recommended every year.  Tetanus, diphtheria, and acellular pertussis (Tdap, Td) vaccine. You may need a Td booster every 10 years.  Zoster vaccine. You may need this after age 30.  Pneumococcal 13-valent conjugate (PCV13) vaccine. One dose is recommended after age 35.  Pneumococcal polysaccharide (PPSV23) vaccine. One dose is recommended after age 78. Talk to your health care provider about which screenings and vaccines you need and how often you need them. This information is not intended to replace advice given to you by your health care provider. Make sure you discuss any questions you have with your health care provider. Document Released: 01/06/2016 Document Revised: 08/29/2016 Document Reviewed: 10/11/2015 Elsevier Interactive Patient Education  2017 [place] Prevention in the Home Falls can cause injuries. They can happen to people of all ages. There are many things you can do to make your home safe and to help prevent falls. What can I do on the outside of my home?  Regularly fix the edges of walkways and driveways and fix any cracks.  Remove anything that might make you trip as you walk through a door, such as a raised step or threshold.  Trim any bushes or trees on the path to your home.  Use bright outdoor lighting.  Clear any walking paths of anything that might make someone trip, such as rocks or tools.  Regularly check to see if handrails are loose or broken. Make sure that both sides of any steps have handrails.  Any raised decks and porches should have guardrails on the edges.  Have any leaves, snow, or ice cleared regularly.  Use sand or salt on walking paths during winter.  Clean up any spills in your garage right away. This includes oil or grease  spills. What can I do in the bathroom?  Use night lights.  Install grab bars by the toilet and in the tub and shower. Do not use towel bars as grab bars.  Use non-skid mats or decals in the tub or shower.  If you need to sit down in the shower, use a plastic, non-slip stool.  Keep the floor dry. Clean up any water that spills on the floor as soon as it happens.  Remove soap buildup in the tub or shower regularly.  Attach bath mats securely with double-sided non-slip rug tape.  Do not have throw rugs and other things on the floor that can make you trip. What can I do in the bedroom?  Use night lights.  Make sure that you have a light by your bed that is easy to reach.  Do not use any sheets or blankets that are too big for your bed. They should not hang down onto the floor.  Have a firm chair that has side arms. You can use this for support while you get dressed.  Do not have throw rugs and other things on the floor that can make you trip. What can I do in the kitchen?  Clean up any spills right away.  Avoid walking on wet floors.  Keep items that  you use a lot in easy-to-reach places.  If you need to reach something above you, use a strong step stool that has a grab bar.  Keep electrical cords out of the way.  Do not use floor polish or wax that makes floors slippery. If you must use wax, use non-skid floor wax.  Do not have throw rugs and other things on the floor that can make you trip. What can I do with my stairs?  Do not leave any items on the stairs.  Make sure that there are handrails on both sides of the stairs and use them. Fix handrails that are broken or loose. Make sure that handrails are as long as the stairways.  Check any carpeting to make sure that it is firmly attached to the stairs. Fix any carpet that is loose or worn.  Avoid having throw rugs at the top or bottom of the stairs. If you do have throw rugs, attach them to the floor with carpet  tape.  Make sure that you have a light switch at the top of the stairs and the bottom of the stairs. If you do not have them, ask someone to add them for you. What else can I do to help prevent falls?  Wear shoes that:  Do not have high heels.  Have rubber bottoms.  Are comfortable and fit you well.  Are closed at the toe. Do not wear sandals.  If you use a stepladder:  Make sure that it is fully opened. Do not climb a closed stepladder.  Make sure that both sides of the stepladder are locked into place.  Ask someone to hold it for you, if possible.  Clearly mark and make sure that you can see:  Any grab bars or handrails.  First and last steps.  Where the edge of each step is.  Use tools that help you move around (mobility aids) if they are needed. These include:  Canes.  Walkers.  Scooters.  Crutches.  Turn on the lights when you go into a dark area. Replace any light bulbs as soon as they burn out.  Set up your furniture so you have a clear path. Avoid moving your furniture around.  If any of your floors are uneven, fix them.  If there are any pets around you, be aware of where they are.  Review your medicines with your doctor. Some medicines can make you feel dizzy. This can increase your chance of falling. Ask your doctor what other things that you can do to help prevent falls. This information is not intended to replace advice given to you by your health care provider. Make sure you discuss any questions you have with your health care provider. Document Released: 10/06/2009 Document Revised: 05/17/2016 Document Reviewed: 01/14/2015 Elsevier Interactive Patient Education  2017 [employer].

## 2018-11-10 NOTE — Progress Notes (Addendum)
Subjective:   Catherine Pena is a 72 y.o. female who presents for Medicare Annual (Subsequent) preventive examination.  Review of Systems:   Cardiac Risk Factors include: advanced age (>77men, >66 women);hypertension;obesity (BMI >30kg/m2)     Objective:     Vitals: BP 132/84 (BP Location: Left Arm, Patient Position: Sitting, Cuff Size: Normal)   Pulse 64   Temp 97.9 F (36.6 C) (Oral)   Resp 16   Ht 5\' 4"  (1.626 m)   Wt 194 lb 9.6 oz (88.3 kg)   BMI 33.40 kg/m   Body mass index is 33.4 kg/m.  Advanced Directives 11/10/2018 01/12/2018 11/07/2017  Does Patient Have a Medical Advance Directive? No No No  Does patient want to make changes to medical advance directive? Yes (MAU/Ambulatory/Procedural Areas - Information given) - -  Would patient like information on creating a medical advance directive? - - Yes (MAU/Ambulatory/Procedural Areas - Information given)    Tobacco Social History   Tobacco Use  Smoking Status Former Smoker  . Packs/day: 0.25  . Years: 20.00  . Pack years: 5.00  . Types: Cigarettes  Smokeless Tobacco Never Used     Counseling given: Not Answered   Clinical Intake:  Pre-visit preparation completed: Yes  Pain : 0-10 Pain Score: 5  Pain Type: Chronic pain Pain Location: Knee Pain Orientation: Left Pain Descriptors / Indicators: Aching Pain Onset: More than a month ago(pt feels like it may be arthritis) Pain Frequency: Intermittent Pain Relieving Factors: OTC pain meds Effect of Pain on Daily Activities: stands at work all day  Pain Relieving Factors: OTC pain meds  Nutritional Status: BMI > 30  Obese Nutritional Risks: None Diabetes: No  How often do you need to have someone help you when you read instructions, pamphlets, or other written materials from your doctor or pharmacy?: 1 - Never What is the last grade level you completed in school?: 12th grade  Interpreter Needed?: No  Information entered by :: Reather Littler LPN  Past  Medical History:  Diagnosis Date  . Glaucoma   . Hypertension    Past Surgical History:  Procedure Laterality Date  . CATARACT EXTRACTION, BILATERAL Bilateral 10/2016 and 11/2016  . EYE SURGERY     Family History  Problem Relation Age of Onset  . Ovarian cancer Mother   . Diabetes Father    Social History   Socioeconomic History  . Marital status: Single    Spouse name: Not on file  . Number of children: 3  . Years of education: high school graduate  . Highest education level: High school graduate  Occupational History  . Occupation: Pensions consultant  . Financial resource strain: Not hard at all  . Food insecurity:    Worry: Never true    Inability: Never true  . Transportation needs:    Medical: No    Non-medical: No  Tobacco Use  . Smoking status: Former Smoker    Packs/day: 0.25    Years: 20.00    Pack years: 5.00    Types: Cigarettes  . Smokeless tobacco: Never Used  Substance and Sexual Activity  . Alcohol use: No    Frequency: Never  . Drug use: No  . Sexual activity: Not Currently  Lifestyle  . Physical activity:    Days per week: 0 days    Minutes per session: 0 min  . Stress: Not at all  Relationships  . Social connections:    Talks on phone: More than three times  a week    Gets together: Twice a week    Attends religious service: More than 4 times per year    Active member of club or organization: No    Attends meetings of clubs or organizations: Never    Relationship status: Not on file  Other Topics Concern  . Not on file  Social History Narrative   Pt lives with daughter and son in law    Outpatient Encounter Medications as of 11/10/2018  Medication Sig  . latanoprost (XALATAN) 0.005 % ophthalmic solution Place 1 drop into both eyes at bedtime.  Marland Kitchen. lisinopril (PRINIVIL,ZESTRIL) 5 MG tablet Once daily   No facility-administered encounter medications on file as of 11/10/2018.     Activities of Daily Living In your present state  of health, do you have any difficulty performing the following activities: 11/10/2018  Hearing? N  Comment declines hearing aids  Vision? N  Comment wears glasses  Difficulty concentrating or making decisions? N  Walking or climbing stairs? N  Dressing or bathing? N  Doing errands, shopping? N  Preparing Food and eating ? N  Using the Toilet? N  In the past six months, have you accidently leaked urine? N  Do you have problems with loss of bowel control? N  Managing your Medications? N  Managing your Finances? N  Housekeeping or managing your Housekeeping? N  Some recent data might be hidden    Patient Care Team: Duanne LimerickJones, Deanna C, MD as PCP - General (Family Medicine)    Assessment:   This is a routine wellness examination for Catherine NixonJanice.  Exercise Activities and Dietary recommendations Current Exercise Habits: The patient does not participate in regular exercise at present, Exercise limited by: orthopedic condition(s)(knee pain)  Goals    . Exercise 150 minutes per week (moderate activity)     Try to increase exercise to 3 days per week, 45 minutes OR exercise 5 times per week for 30 minutes      FALL RISK PREVENTION PERTAINING TO THE HOME:  Any stairs in or around the home WITH handrails? Yes  Home free of loose throw rugs in walkways, pet beds, electrical cords, etc? Yes  Adequate lighting in your home to reduce risk of falls? Yes   ASSISTIVE DEVICES UTILIZED TO PREVENT FALLS:  Life alert? No  Use of a cane, walker or w/c? No  Grab bars in the bathroom? YES Shower chair or bench in shower? No  Elevated toilet seat or a handicapped toilet? No   DME ORDERS:  DME order needed?  No   TIMED UP AND GO:  Was the test performed? Yes .  Length of time to ambulate 10 feet: 6 sec.   GAIT:  Appearance of gait: Gait stead-fast and without the use of an assistive device.  Education: Fall risk prevention has been discussed.  Intervention(s) required? No   Fall Risk Fall  Risk  11/10/2018 11/07/2017 08/09/2016  Falls in the past year? 0 No No   Depression Screen PHQ 2/9 Scores 11/10/2018 05/28/2018 11/07/2017 10/24/2017  PHQ - 2 Score 0 0 0 0  PHQ- 9 Score - 0 0 1     Cognitive Function     6CIT Screen 11/07/2017  What Year? 0 points  What month? 0 points  What time? 0 points  Count back from 20 0 points  Months in reverse 0 points  Repeat phrase 0 points  Total Score 0    Immunization History  Administered Date(s) Administered  .  Influenza-Unspecified 10/07/2017    Qualifies for Shingles Vaccine? Yes   Due for Shingrix. Education has been provided regarding the importance of this vaccine. Pt has been advised to call insurance company to determine out of pocket expense. Advised may also receive vaccine at local pharmacy or Health Dept. Verbalized acceptance and understanding.  Tdap: Although this vaccine is not a covered service during a Wellness Exam, does the patient still wish to receive this vaccine today?  No .   Education has been provided regarding the importance of this vaccine. Advised may receive this vaccine at local pharmacy or Health Dept. Aware to provide a copy of the vaccination record if obtained from local pharmacy or Health Dept. Verbalized acceptance and understanding. Pt to check with health department for records.   Flu Vaccine: Pt received at work, she will bring paperwork to next appt.  Pneumococcal Vaccine: Due for Pneumococcal vaccine. Does the patient want to receive this vaccine today?  No . Education has been provided regarding the importance of this vaccine but still declined. Advised may receive this vaccine at local pharmacy or Health Dept. Aware to provide a copy of the vaccination record if obtained from local pharmacy or Health Dept. Verbalized acceptance and understanding.   Screening Tests Health Maintenance  Topic Date Due  . Hepatitis C Screening  1946/12/17  . MAMMOGRAM  03/23/1998  . COLONOSCOPY   12/24/2018 (Originally 03/23/1998)  . TETANUS/TDAP  12/24/2018 (Originally 03/24/1967)  . PNA vac Low Risk Adult (1 of 2 - PCV13) 12/24/2018 (Originally 03/23/2013)  . INFLUENZA VACCINE  Completed  . DEXA SCAN  Completed    Cancer Screenings:  Colorectal Screening: Pt agreeable to colonoscopy options, would like to discuss with Dr. Yetta Barre at next visit to decide between cologuard and colonoscopy.   Mammogram: Pt agreeable to mammogram, ordered today.   Bone Density: Completed 11/20818. Results reflect NORMAL, Repeat every 2 years.   Lung Cancer Screening: (Low Dose CT Chest recommended if Age 65-80 years, 30 pack-year currently smoking OR have quit w/in 15years.) does not qualify.    Additional Screening:  Hepatitis C Screening: does qualify; pt to check if this was done during bloodwork done by employer.   Vision Screening: Recommended annual ophthalmology exams for early detection of glaucoma and other disorders of the eye. Is the patient up to date with their annual eye exam?  Yes  Who is the provider or what is the name of the office in which the pt attends annual eye exams? Dr. Larence Penning   Dental Screening: Recommended annual dental exams for proper oral hygiene  Community Resource Referral:  CRR required this visit?  No      Plan:    I have personally reviewed and addressed the Medicare Annual Wellness questionnaire and have noted the following in the patient's chart:  A. Medical and social history B. Use of alcohol, tobacco or illicit drugs  C. Current medications and supplements D. Functional ability and status E.  Nutritional status F.  Physical activity G. Advance directives H. List of other physicians I.  Hospitalizations, surgeries, and ER visits in previous 12 months J.  Vitals K. Screenings such as hearing and vision if needed, cognitive and depression L. Referrals and appointments   In addition, I have reviewed and discussed with patient certain preventive  protocols, quality metrics, and best practice recommendations. A written personalized care plan for preventive services as well as general preventive health recommendations were provided to patient.   Signed,  Reather Littler,  LPN Nurse Health Advisor   Nurse Notes: mammogram ordered today, pt to consider PCV 13 between now and next visit. Please review colonoscopy options at next appt. Thank you!

## 2018-11-27 DIAGNOSIS — Z961 Presence of intraocular lens: Secondary | ICD-10-CM | POA: Diagnosis not present

## 2018-11-27 DIAGNOSIS — H401131 Primary open-angle glaucoma, bilateral, mild stage: Secondary | ICD-10-CM | POA: Diagnosis not present

## 2018-11-29 ENCOUNTER — Other Ambulatory Visit: Payer: Self-pay | Admitting: Family Medicine

## 2018-11-29 DIAGNOSIS — I10 Essential (primary) hypertension: Secondary | ICD-10-CM

## 2018-12-02 ENCOUNTER — Encounter: Payer: Self-pay | Admitting: Family Medicine

## 2018-12-02 ENCOUNTER — Ambulatory Visit: Payer: BLUE CROSS/BLUE SHIELD | Admitting: Family Medicine

## 2018-12-02 VITALS — BP 120/70 | HR 76 | Ht 64.0 in | Wt 193.0 lb

## 2018-12-02 DIAGNOSIS — E6609 Other obesity due to excess calories: Secondary | ICD-10-CM

## 2018-12-02 DIAGNOSIS — Z23 Encounter for immunization: Secondary | ICD-10-CM | POA: Diagnosis not present

## 2018-12-02 DIAGNOSIS — Z6833 Body mass index (BMI) 33.0-33.9, adult: Secondary | ICD-10-CM

## 2018-12-02 DIAGNOSIS — I10 Essential (primary) hypertension: Secondary | ICD-10-CM | POA: Diagnosis not present

## 2018-12-02 MED ORDER — LISINOPRIL 5 MG PO TABS: ORAL_TABLET | ORAL | 1 refills | Status: DC

## 2018-12-02 NOTE — Patient Instructions (Signed)

## 2018-12-02 NOTE — Progress Notes (Signed)
Date:  12/02/2018   Name:  Catherine Pena   DOB:  September 13, 1946   MRN:  [id number]   Chief Complaint: Hypertension Hypertension  This is a chronic problem. The current episode started more than 1 year ago. The problem is unchanged. The problem is controlled. Pertinent negatives include no anxiety, blurred vision, chest pain, headaches, malaise/fatigue, neck pain, orthopnea, palpitations, peripheral edema, PND, shortness of breath or sweats. There are no associated agents to hypertension. Risk factors for coronary artery disease include obesity and post-menopausal state. Past treatments include ACE inhibitors. The current treatment provides mild improvement. There are no compliance problems.  There is no history of angina, kidney disease, CAD/MI, CVA, heart failure, left ventricular hypertrophy, PVD or retinopathy. There is no history of chronic renal disease, a hypertension causing med or renovascular disease.     Review of Systems  Constitutional: Negative for chills, fever and malaise/fatigue.  HENT: Negative for drooling, ear discharge, ear pain and sore throat.   Eyes: Negative for blurred vision.  Respiratory: Negative for cough, shortness of breath and wheezing.   Cardiovascular: Negative for chest pain, palpitations, orthopnea, leg swelling and PND.  Gastrointestinal: Negative for abdominal pain, blood in stool, constipation, diarrhea and nausea.  Endocrine: Negative for polydipsia.  Genitourinary: Negative for dysuria, frequency, hematuria and urgency.  Musculoskeletal: Negative for back pain, myalgias and neck pain.  Skin: Negative for rash.  Allergic/Immunologic: Negative for environmental allergies.  Neurological: Negative for dizziness and headaches.  Hematological: Does not bruise/bleed easily.  Psychiatric/Behavioral: Negative for suicidal ideas. The patient is not nervous/anxious.     Patient Active Problem List   Diagnosis Date Noted  . Benign essential HTN 04/14/2015    . Adiposity 04/14/2015  . Borderline diabetes 04/14/2015    No Known Allergies  Past Surgical History:  Procedure Laterality Date  . CATARACT EXTRACTION, BILATERAL Bilateral 10/2016 and 11/2016  . EYE SURGERY      Social History   Tobacco Use  . Smoking status: Former Smoker    Packs/day: 0.25    Years: 20.00    Pack years: 5.00    Types: Cigarettes  . Smokeless tobacco: Never Used  Substance Use Topics  . Alcohol use: No    Frequency: Never  . Drug use: No     Medication list has been reviewed and updated.  Current Meds  Medication Sig  . latanoprost (XALATAN) 0.005 % ophthalmic solution Place 1 drop into both eyes at bedtime.  Marland Kitchen. lisinopril (PRINIVIL,ZESTRIL) 5 MG tablet TAKE 1 TABLET BY MOUTH EVERY DAY    PHQ 2/9 Scores 12/02/2018 11/10/2018 05/28/2018 11/07/2017  PHQ - 2 Score 0 0 0 0  PHQ- 9 Score 0 - 0 0    Physical Exam  Constitutional: She is oriented to person, place, and time. She appears well-developed and well-nourished.  HENT:  Head: Normocephalic.  Right Ear: External ear normal.  Left Ear: External ear normal.  Mouth/Throat: Oropharynx is clear and moist.  Eyes: Pupils are equal, round, and reactive to light. Conjunctivae and EOM are normal. Lids are everted and swept, no foreign bodies found. Left eye exhibits no hordeolum. No foreign body present in the left eye. Right conjunctiva is not injected. Left conjunctiva is not injected. No scleral icterus.  Neck: Normal range of motion. Neck supple. No JVD present. No tracheal deviation present. No thyromegaly present.  Cardiovascular: Normal rate, regular rhythm, normal heart sounds and intact distal pulses. Exam reveals no gallop and no friction rub.  No  murmur heard. Pulmonary/Chest: Effort normal and breath sounds normal. No respiratory distress. She has no wheezes. She has no rales.  Abdominal: Soft. Bowel sounds are normal. She exhibits no mass. There is no hepatosplenomegaly. There is no  tenderness. There is no rebound and no guarding.  Musculoskeletal: Normal range of motion. She exhibits no edema or tenderness.  Lymphadenopathy:    She has no cervical adenopathy.  Neurological: She is alert and oriented to person, place, and time. She has normal strength. She displays normal reflexes. No cranial nerve deficit.  Skin: Skin is warm. No rash noted.  Psychiatric: She has a normal mood and affect. Her mood appears not anxious. She does not exhibit a depressed mood.  Nursing note and vitals reviewed.   BP 120/70   Pulse 76   Ht 5\' 4"  (1.626 m)   Wt 193 lb (87.5 kg)   BMI 33.13 kg/m   Assessment and Plan:  1. Benign essential HTN Chronic/ controlled- refill lisinopril/ draw lipid and renal - lisinopril (PRINIVIL,ZESTRIL) 5 MG tablet; TAKE 1 TABLET BY MOUTH EVERY DAY  Dispense: 90 tablet; Refill: 1 - Renal function panel - Lipid Panel With LDL/HDL Ratio  2. Class 1 obesity due to excess calories with serious comorbidity and body mass index (BMI) of 33.0 to 33.9 in adult Discussed diet and exercise  3. Need for vaccination against Streptococcus pneumoniae using pneumococcal conjugate vaccine 13 Discussed and administered - Pneumococcal conjugate vaccine 13-valent IM   Dr. Hayden [facility]  12/02/2018

## 2018-12-11 ENCOUNTER — Other Ambulatory Visit: Payer: BLUE CROSS/BLUE SHIELD

## 2018-12-11 DIAGNOSIS — I10 Essential (primary) hypertension: Secondary | ICD-10-CM

## 2018-12-12 DIAGNOSIS — I10 Essential (primary) hypertension: Secondary | ICD-10-CM | POA: Diagnosis not present

## 2018-12-13 LAB — LIPID PANEL WITH LDL/HDL RATIO
CHOLESTEROL TOTAL: 163 mg/dL (ref 100–199)
HDL: 49 mg/dL (ref >39)
LDL CALC: 97 mg/dL (ref 0–99)
LDl/HDL Ratio: 2 ratio (ref 0.0–3.2)
TRIGLYCERIDES: 86 mg/dL (ref 0–149)
VLDL Cholesterol Cal: 17 mg/dL (ref 5–40)

## 2018-12-13 LAB — RENAL FUNCTION PANEL
ALBUMIN: 4.3 g/dL (ref 3.5–4.8)
BUN/Creatinine Ratio: 20 (ref 12–28)
BUN: 14 mg/dL (ref 8–27)
CALCIUM: 9.5 mg/dL (ref 8.7–10.3)
CO2: 23 mmol/L (ref 20–29)
CREATININE: 0.71 mg/dL (ref 0.57–1.00)
Chloride: 105 mmol/L (ref 96–106)
GFR calc Af Amer: 100 mL/min/{1.73_m2} (ref >59)
GFR calc non Af Amer: 87 mL/min/{1.73_m2} (ref >59)
Glucose: 98 mg/dL (ref 65–99)
PHOSPHORUS: 3.6 mg/dL (ref 2.5–4.5)
Potassium: 4.5 mmol/L (ref 3.5–5.2)
Sodium: 145 mmol/L — ABNORMAL HIGH (ref 134–144)

## 2019-03-04 DIAGNOSIS — H401131 Primary open-angle glaucoma, bilateral, mild stage: Secondary | ICD-10-CM | POA: Diagnosis not present

## 2019-03-04 DIAGNOSIS — Z961 Presence of intraocular lens: Secondary | ICD-10-CM | POA: Diagnosis not present

## 2019-05-11 DIAGNOSIS — Z961 Presence of intraocular lens: Secondary | ICD-10-CM | POA: Diagnosis not present

## 2019-05-11 DIAGNOSIS — H401131 Primary open-angle glaucoma, bilateral, mild stage: Secondary | ICD-10-CM | POA: Diagnosis not present

## 2019-07-08 ENCOUNTER — Other Ambulatory Visit: Payer: Self-pay | Admitting: Family Medicine

## 2019-07-08 DIAGNOSIS — I10 Essential (primary) hypertension: Secondary | ICD-10-CM

## 2019-08-13 DIAGNOSIS — H401131 Primary open-angle glaucoma, bilateral, mild stage: Secondary | ICD-10-CM | POA: Diagnosis not present

## 2019-08-13 DIAGNOSIS — Z961 Presence of intraocular lens: Secondary | ICD-10-CM | POA: Diagnosis not present

## 2019-08-27 ENCOUNTER — Other Ambulatory Visit: Payer: Self-pay | Admitting: Family Medicine

## 2019-08-27 DIAGNOSIS — I10 Essential (primary) hypertension: Secondary | ICD-10-CM

## 2019-09-20 ENCOUNTER — Other Ambulatory Visit: Payer: Self-pay | Admitting: Family Medicine

## 2019-09-20 DIAGNOSIS — I10 Essential (primary) hypertension: Secondary | ICD-10-CM

## 2019-09-25 ENCOUNTER — Ambulatory Visit: Payer: BC Managed Care – PPO | Admitting: Family Medicine

## 2019-09-25 ENCOUNTER — Encounter: Payer: Self-pay | Admitting: Family Medicine

## 2019-09-25 ENCOUNTER — Other Ambulatory Visit: Payer: Self-pay

## 2019-09-25 VITALS — BP 120/80 | HR 60 | Ht 64.0 in | Wt 191.0 lb

## 2019-09-25 DIAGNOSIS — I10 Essential (primary) hypertension: Secondary | ICD-10-CM | POA: Diagnosis not present

## 2019-09-25 MED ORDER — LISINOPRIL 5 MG PO TABS: 5.0000 mg | ORAL_TABLET | Freq: Every day | ORAL | 1 refills | Status: DC

## 2019-09-25 NOTE — Progress Notes (Signed)
Date:  09/25/2019   Name:  Catherine Pena   DOB:  10/27/1946   MRN:  [id number]   Chief Complaint: Hypertension  Hypertension This is a chronic problem. The current episode started more than 1 year ago. The problem has been gradually improving since onset. The problem is controlled. Pertinent negatives include no anxiety, blurred vision, chest pain, headaches, malaise/fatigue, neck pain, orthopnea, palpitations, peripheral edema, PND, shortness of breath or sweats. There are no associated agents to hypertension. There are no known risk factors for coronary artery disease. Past treatments include ACE inhibitors. The current treatment provides moderate improvement. There are no compliance problems.  There is no history of angina, kidney disease, CAD/MI, CVA, heart failure, left ventricular hypertrophy, PVD or retinopathy. There is no history of chronic renal disease, a hypertension causing med or renovascular disease.    Review of Systems  Constitutional: Negative for chills, fever and malaise/fatigue.  HENT: Negative for drooling, ear discharge, ear pain and sore throat.   Eyes: Negative for blurred vision.  Respiratory: Negative for cough, shortness of breath and wheezing.   Cardiovascular: Negative for chest pain, palpitations, orthopnea, leg swelling and PND.  Gastrointestinal: Negative for abdominal pain, blood in stool, constipation, diarrhea and nausea.  Endocrine: Negative for polydipsia.  Genitourinary: Negative for dysuria, frequency, hematuria and urgency.  Musculoskeletal: Negative for back pain, myalgias and neck pain.  Skin: Negative for rash.  Allergic/Immunologic: Negative for environmental allergies.  Neurological: Negative for dizziness and headaches.  Hematological: Does not bruise/bleed easily.  Psychiatric/Behavioral: Negative for suicidal ideas. The patient is not nervous/anxious.     Patient Active Problem List   Diagnosis Date Noted  . Benign essential HTN  04/14/2015  . Adiposity 04/14/2015  . Borderline diabetes 04/14/2015    No Known Allergies  Past Surgical History:  Procedure Laterality Date  . CATARACT EXTRACTION, BILATERAL Bilateral 10/2016 and 11/2016  . EYE SURGERY      Social History   Tobacco Use  . Smoking status: Former Smoker    Packs/day: 0.25    Years: 20.00    Pack years: 5.00    Types: Cigarettes  . Smokeless tobacco: Never Used  Substance Use Topics  . Alcohol use: No    Frequency: Never  . Drug use: No     Medication list has been reviewed and updated.  Current Meds  Medication Sig  . latanoprost (XALATAN) 0.005 % ophthalmic solution Place 1 drop into both eyes at bedtime.  Marland Kitchen lisinopril (ZESTRIL) 5 MG tablet TAKE 1 TABLET BY MOUTH EVERY DAY    PHQ 2/9 Scores 09/25/2019 12/02/2018 11/10/2018 05/28/2018  PHQ - 2 Score 0 0 0 0  PHQ- 9 Score 0 0 - 0    BP Readings from Last 3 Encounters:  09/25/19 120/80  12/02/18 120/70  11/10/18 132/84    Physical Exam Vitals signs and nursing note reviewed.  Constitutional:      Appearance: She is well-developed.  HENT:     Head: Normocephalic.     Right Ear: Tympanic membrane, ear canal and external ear normal.     Left Ear: Tympanic membrane, ear canal and external ear normal.     Nose: Nose normal.  Eyes:     General: Lids are everted, no foreign bodies appreciated. No scleral icterus.       Left eye: No foreign body or hordeolum.     Conjunctiva/sclera: Conjunctivae normal.     Right eye: Right conjunctiva is not injected.  Left eye: Left conjunctiva is not injected.     Pupils: Pupils are equal, round, and reactive to light.  Neck:     Musculoskeletal: Normal range of motion and neck supple.     Thyroid: No thyromegaly.     Vascular: No JVD.     Trachea: No tracheal deviation.  Cardiovascular:     Rate and Rhythm: Normal rate and regular rhythm.     Heart sounds: Normal heart sounds. No murmur. No friction rub. No gallop.   Pulmonary:      Effort: Pulmonary effort is normal. No respiratory distress.     Breath sounds: Normal breath sounds. No wheezing, rhonchi or rales.  Abdominal:     General: Bowel sounds are normal.     Palpations: Abdomen is soft. There is no mass.     Tenderness: There is no abdominal tenderness. There is no guarding or rebound.  Musculoskeletal: Normal range of motion.        General: No tenderness.  Lymphadenopathy:     Cervical: No cervical adenopathy.  Skin:    General: Skin is warm.     Findings: No rash.  Neurological:     Mental Status: She is alert and oriented to person, place, and time.     Cranial Nerves: No cranial nerve deficit.     Deep Tendon Reflexes: Reflexes normal.  Psychiatric:        Mood and Affect: Mood is not anxious or depressed.     Wt Readings from Last 3 Encounters:  09/25/19 191 lb (86.6 kg)  12/02/18 193 lb (87.5 kg)  11/10/18 194 lb 9.6 oz (88.3 kg)    BP 120/80   Pulse 60   Ht 5\' 4"  (1.626 m)   Wt 191 lb (86.6 kg)   BMI 32.79 kg/m   Assessment and Plan:  1. Benign essential HTN Chronic.  Controlled.  Reviewed previous labs within normal limits.  Will continue lisinopril 5 mg once a day.  Will check renal function panel today and recheck patient in 6 months for follow-up blood pressure check. - Renal Function Panel - lisinopril (ZESTRIL) 5 MG tablet; Take 1 tablet (5 mg total) by mouth daily.  Dispense: 90 tablet; Refill: 1

## 2019-09-26 LAB — RENAL FUNCTION PANEL
Albumin: 4.3 g/dL (ref 3.7–4.7)
BUN/Creatinine Ratio: 15 (ref 12–28)
BUN: 12 mg/dL (ref 8–27)
CO2: 23 mmol/L (ref 20–29)
Calcium: 9.7 mg/dL (ref 8.7–10.3)
Chloride: 104 mmol/L (ref 96–106)
Creatinine, Ser: 0.79 mg/dL (ref 0.57–1.00)
GFR calc Af Amer: 87 mL/min/{1.73_m2} (ref >59)
GFR calc non Af Amer: 76 mL/min/{1.73_m2} (ref >59)
Glucose: 79 mg/dL (ref 65–99)
Phosphorus: 3.6 mg/dL (ref 3.0–4.3)
Potassium: 4.6 mmol/L (ref 3.5–5.2)
Sodium: 139 mmol/L (ref 134–144)

## 2019-11-16 ENCOUNTER — Ambulatory Visit: Payer: BLUE CROSS/BLUE SHIELD

## 2019-11-30 ENCOUNTER — Other Ambulatory Visit: Payer: Self-pay

## 2019-11-30 ENCOUNTER — Ambulatory Visit (INDEPENDENT_AMBULATORY_CARE_PROVIDER_SITE_OTHER): Payer: BC Managed Care – PPO

## 2019-11-30 VITALS — BP 138/82 | HR 77 | Temp 97.2°F | Resp 16 | Ht 64.0 in | Wt 192.6 lb

## 2019-11-30 DIAGNOSIS — H401131 Primary open-angle glaucoma, bilateral, mild stage: Secondary | ICD-10-CM | POA: Diagnosis not present

## 2019-11-30 DIAGNOSIS — Z0000 Encounter for general adult medical examination without abnormal findings: Secondary | ICD-10-CM

## 2019-11-30 DIAGNOSIS — Z961 Presence of intraocular lens: Secondary | ICD-10-CM | POA: Diagnosis not present

## 2019-11-30 DIAGNOSIS — Z1211 Encounter for screening for malignant neoplasm of colon: Secondary | ICD-10-CM

## 2019-11-30 DIAGNOSIS — Z1231 Encounter for screening mammogram for malignant neoplasm of breast: Secondary | ICD-10-CM

## 2019-11-30 NOTE — Patient Instructions (Signed)
Catherine Pena , Thank you for taking time to come for your Medicare Wellness Visit. I appreciate your ongoing commitment to your health goals. Please review the following plan we discussed and let me know if I can assist you in the future.   Screening recommendations/referrals: Colonoscopy: Referral sent to [facility] today. They will contact you regarding scheduling for screening colonoscopy. Mammogram: Please call [phone] to schedule your mammogram.  Bone Density: done 11/12/17 Recommended yearly ophthalmology/optometry visit for glaucoma screening and checkup Recommended yearly dental visit for hygiene and checkup  Vaccinations: Influenza vaccine: done 09/2019 Pneumococcal vaccine: done 12/02/18 due for second pneumonia vaccine Tdap vaccine: due Shingles vaccine: Shingrix discussed. Please contact your pharmacy for coverage information.   Advanced directives: Advance directive discussed with you today. I have provided a copy for you to complete at home and have notarized. Once this is complete please bring a copy in to our office so we can scan it into your chart.  Conditions/risks identified: Recommend continuing physical activity at least 3 days per week  Next appointment: Please follow up in one year for your Medicare Annual Wellness visit.     Preventive Care 71 Years and Older, Female Preventive care refers to lifestyle choices and visits with your health care provider that can promote health and wellness. What does preventive care include?  A yearly physical exam. This is also called an annual well check.  Dental exams once or twice a year.  Routine eye exams. Ask your health care provider how often you should have your eyes checked.  Personal lifestyle choices, including:  Daily care of your teeth and gums.  Regular physical activity.  Eating a healthy diet.  Avoiding tobacco and drug use.  Limiting alcohol use.  Practicing safe sex.  Taking low-dose  aspirin every day.  Taking vitamin and mineral supplements as recommended by your health care provider. What happens during an annual well check? The services and screenings done by your health care provider during your annual well check will depend on your age, overall health, lifestyle risk factors, and family history of disease. Counseling  Your health care provider may ask you questions about your:  Alcohol use.  Tobacco use.  Drug use.  Emotional well-being.  Home and relationship well-being.  Sexual activity.  Eating habits.  History of falls.  Memory and ability to understand (cognition).  Work and work Statistician.  Reproductive health. Screening  You may have the following tests or measurements:  Height, weight, and BMI.  Blood pressure.  Lipid and cholesterol levels. These may be checked every 5 years, or more frequently if you are over 71 years old.  Skin check.  Lung cancer screening. You may have this screening every year starting at age 71 if you have a 30-pack-year history of smoking and currently smoke or have quit within the past 15 years.  Fecal occult blood test (FOBT) of the stool. You may have this test every year starting at age 71.  Flexible sigmoidoscopy or colonoscopy. You may have a sigmoidoscopy every 5 years or a colonoscopy every 10 years starting at age 71.  Hepatitis C blood test.  Hepatitis B blood test.  Sexually transmitted disease (STD) testing.  Diabetes screening. This is done by checking your blood sugar (glucose) after you have not eaten for a while (fasting). You may have this done every 1-3 years.  Bone density scan. This is done to screen for osteoporosis. You may have this done starting at age 71.  Mammogram. This may be done every 1-2 years. Talk to your health care provider about how often you should have regular mammograms. Talk with your health care provider about your test results, treatment options, and if  necessary, the need for more tests. Vaccines  Your health care provider may recommend certain vaccines, such as:  Influenza vaccine. This is recommended every year.  Tetanus, diphtheria, and acellular pertussis (Tdap, Td) vaccine. You may need a Td booster every 10 years.  Zoster vaccine. You may need this after age 23.  Pneumococcal 13-valent conjugate (PCV13) vaccine. One dose is recommended after age 71.  Pneumococcal polysaccharide (PPSV23) vaccine. One dose is recommended after age 66. Talk to your health care provider about which screenings and vaccines you need and how often you need them. This information is not intended to replace advice given to you by your health care provider. Make sure you discuss any questions you have with your health care provider. Document Released: 01/06/2016 Document Revised: 08/29/2016 Document Reviewed: 10/11/2015 Elsevier Interactive Patient Education  2017 [place] Prevention in the Home Falls can cause injuries. They can happen to people of all ages. There are many things you can do to make your home safe and to help prevent falls. What can I do on the outside of my home?  Regularly fix the edges of walkways and driveways and fix any cracks.  Remove anything that might make you trip as you walk through a door, such as a raised step or threshold.  Trim any bushes or trees on the path to your home.  Use bright outdoor lighting.  Clear any walking paths of anything that might make someone trip, such as rocks or tools.  Regularly check to see if handrails are loose or broken. Make sure that both sides of any steps have handrails.  Any raised decks and porches should have guardrails on the edges.  Have any leaves, snow, or ice cleared regularly.  Use sand or salt on walking paths during winter.  Clean up any spills in your garage right away. This includes oil or grease spills. What can I do in the bathroom?  Use night lights.   Install grab bars by the toilet and in the tub and shower. Do not use towel bars as grab bars.  Use non-skid mats or decals in the tub or shower.  If you need to sit down in the shower, use a plastic, non-slip stool.  Keep the floor dry. Clean up any water that spills on the floor as soon as it happens.  Remove soap buildup in the tub or shower regularly.  Attach bath mats securely with double-sided non-slip rug tape.  Do not have throw rugs and other things on the floor that can make you trip. What can I do in the bedroom?  Use night lights.  Make sure that you have a light by your bed that is easy to reach.  Do not use any sheets or blankets that are too big for your bed. They should not hang down onto the floor.  Have a firm chair that has side arms. You can use this for support while you get dressed.  Do not have throw rugs and other things on the floor that can make you trip. What can I do in the kitchen?  Clean up any spills right away.  Avoid walking on wet floors.  Keep items that you use a lot in easy-to-reach places.  If you need to reach  something above you, use a strong step stool that has a grab bar.  Keep electrical cords out of the way.  Do not use floor polish or wax that makes floors slippery. If you must use wax, use non-skid floor wax.  Do not have throw rugs and other things on the floor that can make you trip. What can I do with my stairs?  Do not leave any items on the stairs.  Make sure that there are handrails on both sides of the stairs and use them. Fix handrails that are broken or loose. Make sure that handrails are as long as the stairways.  Check any carpeting to make sure that it is firmly attached to the stairs. Fix any carpet that is loose or worn.  Avoid having throw rugs at the top or bottom of the stairs. If you do have throw rugs, attach them to the floor with carpet tape.  Make sure that you have a light switch at the top of the  stairs and the bottom of the stairs. If you do not have them, ask someone to add them for you. What else can I do to help prevent falls?  Wear shoes that:  Do not have high heels.  Have rubber bottoms.  Are comfortable and fit you well.  Are closed at the toe. Do not wear sandals.  If you use a stepladder:  Make sure that it is fully opened. Do not climb a closed stepladder.  Make sure that both sides of the stepladder are locked into place.  Ask someone to hold it for you, if possible.  Clearly mark and make sure that you can see:  Any grab bars or handrails.  First and last steps.  Where the edge of each step is.  Use tools that help you move around (mobility aids) if they are needed. These include:  Canes.  Walkers.  Scooters.  Crutches.  Turn on the lights when you go into a dark area. Replace any light bulbs as soon as they burn out.  Set up your furniture so you have a clear path. Avoid moving your furniture around.  If any of your floors are uneven, fix them.  If there are any pets around you, be aware of where they are.  Review your medicines with your doctor. Some medicines can make you feel dizzy. This can increase your chance of falling. Ask your doctor what other things that you can do to help prevent falls. This information is not intended to replace advice given to you by your health care provider. Make sure you discuss any questions you have with your health care provider. Document Released: 10/06/2009 Document Revised: 05/17/2016 Document Reviewed: 01/14/2015 Elsevier Interactive Patient Education  2017 [employer].

## 2019-11-30 NOTE — Progress Notes (Signed)
Subjective:   Catherine Pena is a 73 y.o. female who presents for Medicare Annual (Subsequent) preventive examination.  Review of Systems:   Cardiac Risk Factors include: advanced age (>5955men, 31>65 women);hypertension;obesity (BMI >30kg/m2)     Objective:     Vitals: BP 138/82 (BP Location: Left Arm, Patient Position: Sitting, Cuff Size: Normal)    Pulse 77    Temp (!) 97.2 F (36.2 C) (Temporal)    Resp 16    Ht 5\' 4"  (1.626 m)    Wt 192 lb 9.6 oz (87.4 kg)    SpO2 97%    BMI 33.06 kg/m   Body mass index is 33.06 kg/m.  Advanced Directives 11/30/2019 11/10/2018 01/12/2018 11/07/2017  Does Patient Have a Medical Advance Directive? No No No No  Does patient want to make changes to medical advance directive? - Yes (MAU/Ambulatory/Procedural Areas - Information given) - -  Would patient like information on creating a medical advance directive? Yes (MAU/Ambulatory/Procedural Areas - Information given) - - Yes (MAU/Ambulatory/Procedural Areas - Information given)    Tobacco Social History   Tobacco Use  Smoking Status Former Smoker   Packs/day: 0.25   Years: 20.00   Pack years: 5.00   Types: Cigarettes  Smokeless Tobacco Never Used     Counseling given: Not Answered   Clinical Intake:  Pre-visit preparation completed: Yes  Pain : No/denies pain     BMI - recorded: 33.06 Nutritional Status: BMI > 30  Obese Nutritional Risks: None Diabetes: No  How often do you need to have someone help you when you read instructions, pamphlets, or other written materials from your doctor or pharmacy?: 1 - Never  Interpreter Needed?: No  Information entered by :: Reather LittlerKasey Thalya Fouche LPN  Past Medical History:  Diagnosis Date   Glaucoma    Hypertension    Past Surgical History:  Procedure Laterality Date   CATARACT EXTRACTION, BILATERAL Bilateral 10/2016 and 11/2016   EYE SURGERY     Family History  Problem Relation Age of Onset   Ovarian cancer Mother    Diabetes Father     Social History   Socioeconomic History   Marital status: Single    Spouse name: Not on file   Number of children: 3   Years of education: high school graduate   Highest education level: High school graduate  Occupational History   Occupation: Advertising copywriternspector  Social Needs   Financial resource strain: Not hard at all   Food insecurity    Worry: Never true    Inability: Never true   Transportation needs    Medical: No    Non-medical: No  Tobacco Use   Smoking status: Former Smoker    Packs/day: 0.25    Years: 20.00    Pack years: 5.00    Types: Cigarettes   Smokeless tobacco: Never Used  Substance and Sexual Activity   Alcohol use: No    Frequency: Never   Drug use: No   Sexual activity: Not Currently  Lifestyle   Physical activity    Days per week: 3 days    Minutes per session: 30 min   Stress: Not at all  Relationships   Social connections    Talks on phone: More than three times a week    Gets together: Twice a week    Attends religious service: More than 4 times per year    Active member of club or organization: No    Attends meetings of clubs or organizations: Never  Relationship status: Not on file  Other Topics Concern   Not on file  Social History Narrative   Pt lives with daughter and son in law    Outpatient Encounter Medications as of 11/30/2019  Medication Sig   latanoprost (XALATAN) 0.005 % ophthalmic solution Place 1 drop into both eyes at bedtime.   lisinopril (ZESTRIL) 5 MG tablet Take 1 tablet (5 mg total) by mouth daily.   timolol (BETIMOL) 0.5 % ophthalmic solution Place 1 drop into the right eye daily.   No facility-administered encounter medications on file as of 11/30/2019.     Activities of Daily Living In your present state of health, do you have any difficulty performing the following activities: 11/30/2019  Hearing? N  Comment declines hearing aids  Vision? N  Difficulty concentrating or making decisions? N    Walking or climbing stairs? N  Dressing or bathing? N  Doing errands, shopping? N  Preparing Food and eating ? N  Using the Toilet? N  In the past six months, have you accidently leaked urine? N  Do you have problems with loss of bowel control? N  Managing your Medications? N  Managing your Finances? N  Housekeeping or managing your Housekeeping? N  Some recent data might be hidden    Patient Care Team: Duanne Limerick, MD as PCP - General (Family Medicine)    Assessment:   This is a routine wellness examination for Catherine Pena.  Exercise Activities and Dietary recommendations Current Exercise Habits: Home exercise routine, Type of exercise: walking, Time (Minutes): 30, Frequency (Times/Week): 3, Weekly Exercise (Minutes/Week): 90, Intensity: Mild, Exercise limited by: None identified  Goals     Exercise 150 minutes per week (moderate activity)     Try to increase exercise to 3 days per week, 45 minutes OR exercise 5 times per week for 30 minutes       Fall Risk Fall Risk  11/30/2019 12/02/2018 11/10/2018 11/07/2017 08/09/2016  Falls in the past year? 0 0 0 No No  Number falls in past yr: 0 - - - -  Injury with Fall? 0 - - - -  Follow up Falls prevention discussed Falls evaluation completed - - -   FALL RISK PREVENTION PERTAINING TO THE HOME:  Any stairs in or around the home? Yes  If so, do they handrails? Yes   Home free of loose throw rugs in walkways, pet beds, electrical cords, etc? Yes  Adequate lighting in your home to reduce risk of falls? Yes   ASSISTIVE DEVICES UTILIZED TO PREVENT FALLS:  Life alert? No  Use of a cane, walker or w/c? No  Grab bars in the bathroom? No  Shower chair or bench in shower? Yes  Elevated toilet seat or a handicapped toilet? No   DME ORDERS:  DME order needed?  No   TIMED UP AND GO:  Was the test performed? Yes .  Length of time to ambulate 10 feet: 5 sec.   GAIT:  Appearance of gait: Gait stead-fast and without the use of  an assistive device.  Education: Fall risk prevention has been discussed.  Intervention(s) required? No    Depression Screen PHQ 2/9 Scores 11/30/2019 09/25/2019 12/02/2018 11/10/2018  PHQ - 2 Score 0 0 0 0  PHQ- 9 Score - 0 0 -     Cognitive Function     6CIT Screen 11/30/2019 11/07/2017  What Year? 0 points 0 points  What month? 0 points 0 points  What time? 0 points  0 points  Count back from 20 0 points 0 points  Months in reverse 0 points 0 points  Repeat phrase 0 points 0 points  Total Score 0 0    Immunization History  Administered Date(s) Administered   Influenza-Unspecified 10/07/2017, 10/03/2019   Pneumococcal Conjugate-13 12/02/2018    Qualifies for Shingles Vaccine? Yes  . Due for Shingrix. Education has been provided regarding the importance of this vaccine. Pt has been advised to call insurance company to determine out of pocket expense. Advised may also receive vaccine at local pharmacy or Health Dept. Verbalized acceptance and understanding.  Tdap: Although this vaccine is not a covered service during a Wellness Exam, does the patient still wish to receive this vaccine today?  No .  Education has been provided regarding the importance of this vaccine. Advised may receive this vaccine at local pharmacy or Health Dept. Aware to provide a copy of the vaccination record if obtained from local pharmacy or Health Dept. Verbalized acceptance and understanding.  Flu Vaccine: Up to date  Pneumococcal Vaccine: Due for PPSV23 Pneumococcal vaccine. After 12/03/19  Screening Tests Health Maintenance  Topic Date Due   MAMMOGRAM  03/23/1998   Hepatitis C Screening  12/03/2019 (Originally 25-Oct-1946)   COLONOSCOPY  09/24/2020 (Originally 03/23/1998)   TETANUS/TDAP  09/24/2020 (Originally 03/24/1965)   PNA vac Low Risk Adult (2 of 2 - PPSV23) 12/03/2019   INFLUENZA VACCINE  Completed   DEXA SCAN  Completed    Cancer Screenings:  Colorectal Screening: Not  completed.  Referral to GI placed today. Pt aware the office will call re: appt.  Mammogram: Due. Ordered today. Pt provided with contact information and advised to call to schedule appt.   Bone Density: Completed 11/12/17. Results reflect NORMAL. Repeat every 2 years. Pt declines repeat screening at this time.   Lung Cancer Screening: (Low Dose CT Chest recommended if Age 57-80 years, 30 pack-year currently smoking OR have quit w/in 15years.) does not qualify.    Additional Screening:  Hepatitis C Screening: does qualify; postponed  Vision Screening: Recommended annual ophthalmology exams for early detection of glaucoma and other disorders of the eye. Is the patient up to date with their annual eye exam?  Yes  Who is the provider or what is the name of the office in which the pt attends annual eye exams? Dr. Matilde Sprang  Dental Screening: Recommended annual dental exams for proper oral hygiene  Community Resource Referral:  CRR required this visit?  No      Plan:     I have personally reviewed and addressed the Medicare Annual Wellness questionnaire and have noted the following in the patients chart:  A. Medical and social history B. Use of alcohol, tobacco or illicit drugs  C. Current medications and supplements D. Functional ability and status E.  Nutritional status F.  Physical activity G. Advance directives H. List of other physicians I.  Hospitalizations, surgeries, and ER visits in previous 12 months J.  [place] such as hearing and vision if needed, cognitive and depression L. Referrals and appointments   In addition, I have reviewed and discussed with patient certain preventive protocols, quality metrics, and best practice recommendations. A written personalized care plan for preventive services as well as general preventive health recommendations were provided to patient.   Signed,  Clemetine Marker, LPN Nurse Health Advisor   Nurse Notes:

## 2019-12-08 ENCOUNTER — Ambulatory Visit (INDEPENDENT_AMBULATORY_CARE_PROVIDER_SITE_OTHER): Payer: BC Managed Care – PPO

## 2019-12-08 ENCOUNTER — Other Ambulatory Visit: Payer: Self-pay

## 2019-12-08 ENCOUNTER — Encounter: Payer: Self-pay | Admitting: *Deleted

## 2019-12-08 DIAGNOSIS — Z23 Encounter for immunization: Secondary | ICD-10-CM

## 2020-02-24 ENCOUNTER — Ambulatory Visit: Payer: BC Managed Care – PPO | Admitting: Family Medicine

## 2020-02-24 ENCOUNTER — Encounter: Payer: Self-pay | Admitting: Family Medicine

## 2020-02-24 ENCOUNTER — Other Ambulatory Visit: Payer: Self-pay

## 2020-02-24 VITALS — BP 130/70 | HR 72 | Ht 64.0 in | Wt 193.0 lb

## 2020-02-24 DIAGNOSIS — I10 Essential (primary) hypertension: Secondary | ICD-10-CM | POA: Diagnosis not present

## 2020-02-24 MED ORDER — LISINOPRIL 5 MG PO TABS: 5.0000 mg | ORAL_TABLET | Freq: Every day | ORAL | 1 refills | Status: DC

## 2020-02-24 NOTE — Progress Notes (Signed)
Date:  02/24/2020   Name:  Catherine Pena   DOB:  [birth year]   MRN:  [id number]   Chief Complaint: Hypertension  Hypertension This is a chronic problem. The current episode started more than 1 year ago. The problem has been gradually improving since onset. The problem is controlled. Pertinent negatives include no anxiety, blurred vision, chest pain, headaches, malaise/fatigue, neck pain, orthopnea, palpitations, peripheral edema, PND, shortness of breath or sweats. There are no associated agents to hypertension. Past treatments include ACE inhibitors. The current treatment provides moderate improvement. There are no compliance problems.     Lab Results  Component Value Date   CREATININE 0.79 09/25/2019   BUN 12 09/25/2019   NA 139 09/25/2019   K 4.6 09/25/2019   CL 104 09/25/2019   CO2 23 09/25/2019   Lab Results  Component Value Date   CHOL 163 12/12/2018   HDL 49 12/12/2018   LDLCALC 97 12/12/2018   TRIG 86 12/12/2018   CHOLHDL 3.2 10/24/2017   No results found for: TSH No results found for: HGBA1C   Review of Systems  Constitutional: Negative.  Negative for chills, fatigue, fever, malaise/fatigue and unexpected weight change.  HENT: Negative for congestion, ear discharge, ear pain, rhinorrhea, sinus pressure, sneezing and sore throat.   Eyes: Negative for blurred vision, photophobia, pain, discharge, redness and itching.  Respiratory: Negative for cough, shortness of breath, wheezing and stridor.   Cardiovascular: Negative for chest pain, palpitations, orthopnea and PND.  Gastrointestinal: Negative for abdominal pain, blood in stool, constipation, diarrhea, nausea and vomiting.  Endocrine: Negative for cold intolerance, heat intolerance, polydipsia, polyphagia and polyuria.  Genitourinary: Negative for dysuria, flank pain, frequency, hematuria, menstrual problem, pelvic pain, urgency, vaginal bleeding and vaginal discharge.  Musculoskeletal: Negative for arthralgias,  back pain, myalgias and neck pain.  Skin: Negative for rash.  Allergic/Immunologic: Negative for environmental allergies and food allergies.  Neurological: Negative for dizziness, weakness, light-headedness, numbness and headaches.  Hematological: Negative for adenopathy. Does not bruise/bleed easily.  Psychiatric/Behavioral: Negative for dysphoric mood. The patient is not nervous/anxious.     Patient Active Problem List   Diagnosis Date Noted  . Benign essential HTN 04/14/2015  . Adiposity 04/14/2015  . Borderline diabetes 04/14/2015    No Known Allergies  Past Surgical History:  Procedure Laterality Date  . CATARACT EXTRACTION, BILATERAL Bilateral 10/2016 and 11/2016  . EYE SURGERY      Social History   Tobacco Use  . Smoking status: Former Smoker    Packs/day: 0.25    Years: 20.00    Pack years: 5.00    Types: Cigarettes  . Smokeless tobacco: Never Used  Substance Use Topics  . Alcohol use: No  . Drug use: No     Medication list has been reviewed and updated.  Current Meds  Medication Sig  . latanoprost (XALATAN) 0.005 % ophthalmic solution Place 1 drop into both eyes at bedtime.  Marland Kitchen lisinopril (ZESTRIL) 5 MG tablet Take 1 tablet (5 mg total) by mouth daily.  . timolol (BETIMOL) 0.5 % ophthalmic solution Place 1 drop into the right eye daily.    PHQ 2/9 Scores 02/24/2020 11/30/2019 09/25/2019 12/02/2018  PHQ - 2 Score 0 0 0 0  PHQ- 9 Score 0 - 0 0    BP Readings from Last 3 Encounters:  02/24/20 130/70  11/30/19 138/82  09/25/19 120/80    Physical Exam Vitals and nursing note reviewed.  Constitutional:      Appearance: She is  well-developed.  HENT:     Head: Normocephalic.     Right Ear: Tympanic membrane, ear canal and external ear normal.     Left Ear: Tympanic membrane, ear canal and external ear normal.     Nose: Nose normal.     Mouth/Throat:     Mouth: Mucous membranes are moist.  Eyes:     General: Lids are everted, no foreign bodies  appreciated. No scleral icterus.       Left eye: No foreign body or hordeolum.     Conjunctiva/sclera: Conjunctivae normal.     Right eye: Right conjunctiva is not injected.     Left eye: Left conjunctiva is not injected.     Pupils: Pupils are equal, round, and reactive to light.  Neck:     Thyroid: No thyromegaly.     Vascular: No JVD.     Trachea: No tracheal deviation.  Cardiovascular:     Rate and Rhythm: Normal rate and regular rhythm.     Heart sounds: Normal heart sounds. No murmur. No friction rub. No gallop.   Pulmonary:     Effort: Pulmonary effort is normal. No respiratory distress.     Breath sounds: Normal breath sounds. No wheezing, rhonchi or rales.  Abdominal:     General: Bowel sounds are normal.     Palpations: Abdomen is soft. There is no mass.     Tenderness: There is no abdominal tenderness. There is no guarding or rebound.  Musculoskeletal:        General: No tenderness. Normal range of motion.     Cervical back: Normal range of motion and neck supple.  Lymphadenopathy:     Cervical: No cervical adenopathy.  Skin:    General: Skin is warm.     Capillary Refill: Capillary refill takes less than 2 seconds.     Findings: No rash.  Neurological:     Mental Status: She is alert and oriented to person, place, and time.     Cranial Nerves: No cranial nerve deficit.     Deep Tendon Reflexes: Reflexes normal.  Psychiatric:        Mood and Affect: Mood is not anxious or depressed.     Wt Readings from Last 3 Encounters:  02/24/20 193 lb (87.5 kg)  11/30/19 192 lb 9.6 oz (87.4 kg)  09/25/19 191 lb (86.6 kg)    BP 130/70   Pulse 72   Ht 5\' 4"  (1.626 m)   Wt 193 lb (87.5 kg)   BMI 33.13 kg/m   Assessment and Plan: 1. Benign essential HTN Chronic.  Controlled.  Stable.  Blood pressure in reasonable range.  We will continue lisinopril 5 mg once a day. - lisinopril (ZESTRIL) 5 MG tablet; Take 1 tablet (5 mg total) by mouth daily.  Dispense: 90 tablet;  Refill: 1

## 2020-03-07 DIAGNOSIS — Z961 Presence of intraocular lens: Secondary | ICD-10-CM | POA: Diagnosis not present

## 2020-03-07 DIAGNOSIS — H401131 Primary open-angle glaucoma, bilateral, mild stage: Secondary | ICD-10-CM | POA: Diagnosis not present

## 2020-03-19 DIAGNOSIS — Z23 Encounter for immunization: Secondary | ICD-10-CM | POA: Diagnosis not present

## 2020-06-06 DIAGNOSIS — H26493 Other secondary cataract, bilateral: Secondary | ICD-10-CM | POA: Diagnosis not present

## 2020-06-06 DIAGNOSIS — H401131 Primary open-angle glaucoma, bilateral, mild stage: Secondary | ICD-10-CM | POA: Diagnosis not present

## 2020-06-06 DIAGNOSIS — H43812 Vitreous degeneration, left eye: Secondary | ICD-10-CM | POA: Diagnosis not present

## 2020-06-06 DIAGNOSIS — Z961 Presence of intraocular lens: Secondary | ICD-10-CM | POA: Diagnosis not present

## 2020-06-17 DIAGNOSIS — Z961 Presence of intraocular lens: Secondary | ICD-10-CM | POA: Diagnosis not present

## 2020-06-17 DIAGNOSIS — H401132 Primary open-angle glaucoma, bilateral, moderate stage: Secondary | ICD-10-CM | POA: Diagnosis not present

## 2020-06-17 DIAGNOSIS — H26493 Other secondary cataract, bilateral: Secondary | ICD-10-CM | POA: Diagnosis not present

## 2020-06-17 DIAGNOSIS — H26492 Other secondary cataract, left eye: Secondary | ICD-10-CM | POA: Diagnosis not present

## 2020-06-17 DIAGNOSIS — H18413 Arcus senilis, bilateral: Secondary | ICD-10-CM | POA: Diagnosis not present

## 2020-06-22 DIAGNOSIS — Z961 Presence of intraocular lens: Secondary | ICD-10-CM | POA: Diagnosis not present

## 2020-06-22 DIAGNOSIS — H26491 Other secondary cataract, right eye: Secondary | ICD-10-CM | POA: Diagnosis not present

## 2020-06-22 DIAGNOSIS — H43812 Vitreous degeneration, left eye: Secondary | ICD-10-CM | POA: Diagnosis not present

## 2020-06-22 DIAGNOSIS — H401131 Primary open-angle glaucoma, bilateral, mild stage: Secondary | ICD-10-CM | POA: Diagnosis not present

## 2020-07-01 DIAGNOSIS — H26491 Other secondary cataract, right eye: Secondary | ICD-10-CM | POA: Diagnosis not present

## 2020-07-07 DIAGNOSIS — H26491 Other secondary cataract, right eye: Secondary | ICD-10-CM | POA: Diagnosis not present

## 2020-07-07 DIAGNOSIS — Z961 Presence of intraocular lens: Secondary | ICD-10-CM | POA: Diagnosis not present

## 2020-07-07 DIAGNOSIS — H401131 Primary open-angle glaucoma, bilateral, mild stage: Secondary | ICD-10-CM | POA: Diagnosis not present

## 2020-07-07 DIAGNOSIS — H43812 Vitreous degeneration, left eye: Secondary | ICD-10-CM | POA: Diagnosis not present

## 2020-09-08 DIAGNOSIS — H401131 Primary open-angle glaucoma, bilateral, mild stage: Secondary | ICD-10-CM | POA: Diagnosis not present

## 2020-09-08 DIAGNOSIS — Z961 Presence of intraocular lens: Secondary | ICD-10-CM | POA: Diagnosis not present

## 2020-09-08 DIAGNOSIS — H43812 Vitreous degeneration, left eye: Secondary | ICD-10-CM | POA: Diagnosis not present

## 2020-09-12 ENCOUNTER — Other Ambulatory Visit: Payer: Self-pay | Admitting: Family Medicine

## 2020-09-12 DIAGNOSIS — I10 Essential (primary) hypertension: Secondary | ICD-10-CM

## 2020-09-12 NOTE — Telephone Encounter (Signed)
Last appt 6 months ago. Pt has appt 11/30/20  Courtesy refill   #90  0 refills

## 2020-10-13 ENCOUNTER — Ambulatory Visit
Admission: EM | Admit: 2020-10-13 | Discharge: 2020-10-13 | Disposition: A | Discharge status: Home / Self Care | Payer: BC Managed Care – PPO | Attending: Internal Medicine | Admitting: Internal Medicine

## 2020-10-13 ENCOUNTER — Ambulatory Visit (INDEPENDENT_AMBULATORY_CARE_PROVIDER_SITE_OTHER): Payer: BC Managed Care – PPO

## 2020-10-13 ENCOUNTER — Other Ambulatory Visit: Payer: Self-pay

## 2020-10-13 DIAGNOSIS — S83411A Sprain of medial collateral ligament of right knee, initial encounter: Secondary | ICD-10-CM

## 2020-10-13 DIAGNOSIS — M25561 Pain in right knee: Secondary | ICD-10-CM | POA: Diagnosis not present

## 2020-10-13 DIAGNOSIS — M1711 Unilateral primary osteoarthritis, right knee: Secondary | ICD-10-CM | POA: Diagnosis not present

## 2020-10-13 MED ORDER — IBUPROFEN 600 MG PO TABS: 600.0000 mg | ORAL_TABLET | Freq: Four times a day (QID) | ORAL | 0 refills | Status: DC | PRN

## 2020-10-13 NOTE — ED Triage Notes (Signed)
Pt c/o recurrent right knee pain that has increased over the past several days. Denies new injury/traum to area. States Catherine Pena did make a sudden stop while walking today.  Pt specifies pain to medial aspect of knee and radiates to right lateral thigh area when turning right foot outwardly.  Denies numbness tingling to foot/toes., no warmth, pain, edema to calf.  Last took tylenol at approx 1100 today with some improvement and rubbed "icy hot" on knee.

## 2020-10-13 NOTE — ED Provider Notes (Signed)
[facility]    CSN: [id number] Arrival date & time: 10/13/20  1746      History   Chief Complaint Chief Complaint  Patient presents with  . Knee Pain    HPI Catherine Pena is a 74 y.o. female comes to urgent care with right knee pain that increased over the past couple of days.  Patient started experiencing pain when she suddenly got out of bed and in the process twisted her knee.  Pain is constant, sharp, aggravated by bearing weight and movement.  No known relieving factors.  No swelling of the knee..  Patient has had partial relief with applying icy hot and also taking some Tylenol today.  She denies any weakness in the right leg.  She has had intermittent right knee pain over the years.   HPI  Past Medical History:  Diagnosis Date  . Glaucoma   . Hypertension     Patient Active Problem List   Diagnosis Date Noted  . Benign essential HTN 04/14/2015  . Adiposity 04/14/2015  . Borderline diabetes 04/14/2015    Past Surgical History:  Procedure Laterality Date  . CATARACT EXTRACTION, BILATERAL Bilateral 10/2016 and 11/2016  . EYE SURGERY      OB History   No obstetric history on file.      Home Medications    Prior to Admission medications   Medication Sig Start Date End Date Taking? Authorizing Provider  latanoprost (XALATAN) 0.005 % ophthalmic solution Place 1 drop into both eyes at bedtime. 09/30/17  Yes [provider]  lisinopril (ZESTRIL) 5 MG tablet TAKE 1 TABLET BY MOUTH EVERY DAY 09/12/20  Yes Duanne Limerick, MD  timolol (BETIMOL) 0.5 % ophthalmic solution Place 1 drop into the right eye daily.   Yes [provider]  ibuprofen (ADVIL) 600 MG tablet Take 1 tablet (600 mg total) by mouth every 6 (six) hours as needed. 10/13/20   LampteyBritta Mccreedy, MD    Family History Family History  Problem Relation Age of Onset  . Ovarian cancer Mother   . Diabetes Father     Social History Social History   Tobacco Use  .  Smoking status: Former Smoker    Packs/day: 0.25    Years: 20.00    Pack years: 5.00    Types: Cigarettes  . Smokeless tobacco: Never Used  Vaping Use  . Vaping Use: Never used  Substance Use Topics  . Alcohol use: No  . Drug use: No     Allergies   Patient has no known allergies.   Review of Systems Review of Systems  Constitutional: Negative.   Gastrointestinal: Negative.   Genitourinary: Negative.  Negative for difficulty urinating.  Musculoskeletal: Positive for arthralgias. Negative for back pain, gait problem and joint swelling.  Skin: Negative.   Psychiatric/Behavioral: Negative.      Physical Exam Triage Vital Signs ED Triage Vitals  Enc Vitals Group     BP 10/13/20 1822 (!) 155/83     Pulse Rate 10/13/20 1822 66     Resp 10/13/20 1822 18     Temp 10/13/20 1822 98.3 F (36.8 C)     Temp Source 10/13/20 1822 Oral     SpO2 10/13/20 1822 100 %     Weight --      Height --      Head Circumference --      Peak Flow --      Pain Score 10/13/20 1818 9  Pain Loc --      Pain Edu? --      Excl. in GC? --    No data found.  Updated Vital Signs BP (!) 155/83 (BP Location: Left Arm)   Pulse 66   Temp 98.3 F (36.8 C) (Oral)   Resp 18   SpO2 100%   Visual Acuity Right Eye Distance:   Left Eye Distance:   Bilateral Distance:    Right Eye Near:   Left Eye Near:    Bilateral Near:     Physical Exam Vitals and nursing note reviewed.  Constitutional:      General: She is not in acute distress.    Appearance: She is not ill-appearing.  Cardiovascular:     Rate and Rhythm: Normal rate and regular rhythm.  Pulmonary:     Effort: Pulmonary effort is normal.     Breath sounds: Normal breath sounds.  Musculoskeletal:        General: Tenderness present. No swelling. Normal range of motion.     Comments: Tenderness over the right medial collateral ligaments.  Anterior drawer test is negative.  No swelling of the knee.  Skin:    General: Skin is warm.      Capillary Refill: Capillary refill takes less than 2 seconds.     Findings: No erythema or lesion.  Neurological:     Mental Status: She is alert.      UC Treatments / Results  Labs (all labs ordered are listed, but only abnormal results are displayed) Labs Reviewed - No data to display  EKG   Radiology DG Knee 2 Views Right  Result Date: 10/13/2020 CLINICAL DATA:  Right knee pain EXAM: RIGHT KNEE - 1-2 VIEW COMPARISON:  None. FINDINGS: Joint space narrowing in the medial compartment. No acute bony abnormality. Specifically, no fracture, subluxation, or dislocation. IMPRESSION: No acute bony abnormality. Electronically Signed   By: Charlett Nose M.D.   On: 10/13/2020 18:57    Procedures Procedures (including critical care time)  Medications Ordered in UC Medications - No data to display  Initial Impression / Assessment and Plan / UC Course  I have reviewed the triage vital signs and the nursing notes.  Pertinent labs & imaging results that were available during my care of the patient were reviewed by me and considered in my medical decision making (see chart for details).     1.  Right knee sprain: Rest, ice, gentle range of motion exercises Ibuprofen 600 mg every 6 hours as needed for pain Right knee sleeve. Return precautions given If symptoms worsen patient may benefit from orthopedic surgery visit.  I am hopeful that the conservative measures will help with the symptoms. Final Clinical Impressions(s) / UC Diagnoses   Final diagnoses:  Sprain of medial collateral ligament of right knee, initial encounter     Discharge Instructions     Rest, icing of the right knee Gentle range of motion exercises Take medication as tolerated If symptoms persist or worsens as she may need to see an orthopedic surgeon for further evaluation.   ED Prescriptions    Medication Sig Dispense Auth. Provider   ibuprofen (ADVIL) 600 MG tablet Take 1 tablet (600 mg total) by  mouth every 6 (six) hours as needed. 30 tablet Atiana Levier, Britta Mccreedy, MD     PDMP not reviewed this encounter.   Merrilee Jansky, MD 10/13/20 1910

## 2020-10-13 NOTE — Discharge Instructions (Signed)
Rest, icing of the right knee Gentle range of motion exercises Take medication as tolerated If symptoms persist or worsens as she may need to see an orthopedic surgeon for further evaluation.

## 2020-10-14 DIAGNOSIS — S83411A Sprain of medial collateral ligament of right knee, initial encounter: Secondary | ICD-10-CM | POA: Diagnosis not present

## 2020-11-30 ENCOUNTER — Ambulatory Visit: Payer: BC Managed Care – PPO

## 2020-11-30 ENCOUNTER — Telehealth: Payer: Self-pay

## 2020-11-30 NOTE — Telephone Encounter (Signed)
Left msg for pt to call to reschedule appt. Pt may reach me directly at [phone]

## 2020-11-30 NOTE — Telephone Encounter (Unsigned)
Copied from CRM [id number]. Topic: General - Other >> Nov 30, 2020  8:53 AM Catherine Pena wrote: Reason for CRM:  Patient called to cancel her appt. Today because she is out of town at a funeral.  Please call patient to reschedule at a later time.

## 2020-12-06 ENCOUNTER — Other Ambulatory Visit: Payer: Self-pay | Admitting: Family Medicine

## 2020-12-06 DIAGNOSIS — H401131 Primary open-angle glaucoma, bilateral, mild stage: Secondary | ICD-10-CM | POA: Diagnosis not present

## 2020-12-06 DIAGNOSIS — I10 Essential (primary) hypertension: Secondary | ICD-10-CM

## 2020-12-06 DIAGNOSIS — Z961 Presence of intraocular lens: Secondary | ICD-10-CM | POA: Diagnosis not present

## 2020-12-06 DIAGNOSIS — H43812 Vitreous degeneration, left eye: Secondary | ICD-10-CM | POA: Diagnosis not present

## 2020-12-06 NOTE — Telephone Encounter (Signed)
Patient canceled her appointment-and did not reschedule. Rx courtesy has already been given.

## 2021-01-06 ENCOUNTER — Other Ambulatory Visit: Payer: Self-pay

## 2021-01-06 ENCOUNTER — Encounter: Payer: Self-pay | Admitting: Family Medicine

## 2021-01-06 ENCOUNTER — Ambulatory Visit: Payer: BC Managed Care – PPO | Admitting: Family Medicine

## 2021-01-06 VITALS — BP 130/84 | HR 60 | Ht 64.0 in | Wt 198.0 lb

## 2021-01-06 DIAGNOSIS — Z6833 Body mass index (BMI) 33.0-33.9, adult: Secondary | ICD-10-CM | POA: Diagnosis not present

## 2021-01-06 DIAGNOSIS — M170 Bilateral primary osteoarthritis of knee: Secondary | ICD-10-CM

## 2021-01-06 DIAGNOSIS — I10 Essential (primary) hypertension: Secondary | ICD-10-CM

## 2021-01-06 MED ORDER — IBUPROFEN 600 MG PO TABS: 600.0000 mg | ORAL_TABLET | Freq: Four times a day (QID) | ORAL | 0 refills | Status: DC | PRN

## 2021-01-06 MED ORDER — LISINOPRIL 5 MG PO TABS: 5.0000 mg | ORAL_TABLET | Freq: Every day | ORAL | 1 refills | Status: DC

## 2021-01-06 NOTE — Progress Notes (Signed)
Date:  01/06/2021   Name:  Catherine Pena   DOB:  29-May-1946   MRN:  [id number]   Chief Complaint: Hypertension and Arthritis (Would like refill on ibuprofen)  Hypertension This is a chronic problem. The current episode started more than 1 year ago. The problem has been gradually improving since onset. The problem is controlled. Pertinent negatives include no anxiety, blurred vision, chest pain, headaches, malaise/fatigue, neck pain, orthopnea, palpitations, peripheral edema, PND or shortness of breath. Past treatments include ACE inhibitors. The current treatment provides moderate improvement. There are no compliance problems.  There is no history of angina, kidney disease, CAD/MI, CVA, heart failure, left ventricular hypertrophy, PVD or retinopathy.  Arthritis Presents for follow-up visit. She complains of pain and stiffness. She reports no joint swelling or joint warmth. Symptom course: wax and wane. Affected locations include the left knee and right knee. Pertinent negatives include no diarrhea, dysuria, fatigue, fever or rash.    Lab Results  Component Value Date   CREATININE 0.79 09/25/2019   BUN 12 09/25/2019   NA 139 09/25/2019   K 4.6 09/25/2019   CL 104 09/25/2019   CO2 23 09/25/2019   Lab Results  Component Value Date   CHOL 163 12/12/2018   HDL 49 12/12/2018   LDLCALC 97 12/12/2018   TRIG 86 12/12/2018   CHOLHDL 3.2 10/24/2017   No results found for: TSH No results found for: HGBA1C Lab Results  Component Value Date   WBC 5.1 08/06/2016   HGB 13.2 08/06/2016   HCT 37.3 08/06/2016   MCV 82.7 08/06/2016   PLT 137 (L) 08/06/2016   No results found for: ALT, AST, GGT, ALKPHOS, BILITOT   Review of Systems  Constitutional: Negative.  Negative for chills, fatigue, fever, malaise/fatigue and unexpected weight change.  HENT: Negative for congestion, ear discharge, ear pain, rhinorrhea, sinus pressure, sneezing and sore throat.   Eyes: Negative for blurred vision,  double vision, photophobia, pain, discharge, redness and itching.  Respiratory: Negative for cough, shortness of breath, wheezing and stridor.   Cardiovascular: Negative for chest pain, palpitations, orthopnea and PND.  Gastrointestinal: Negative for abdominal pain, blood in stool, constipation, diarrhea, nausea and vomiting.  Endocrine: Negative for cold intolerance, heat intolerance, polydipsia, polyphagia and polyuria.  Genitourinary: Negative for dysuria, flank pain, frequency, hematuria, menstrual problem, pelvic pain, urgency, vaginal bleeding and vaginal discharge.  Musculoskeletal: Positive for arthritis and stiffness. Negative for arthralgias, back pain, joint swelling, myalgias and neck pain.  Skin: Negative for rash.  Allergic/Immunologic: Negative for environmental allergies and food allergies.  Neurological: Negative for dizziness, weakness, light-headedness, numbness and headaches.  Hematological: Negative for adenopathy. Does not bruise/bleed easily.  Psychiatric/Behavioral: Negative for dysphoric mood. The patient is not nervous/anxious.     Patient Active Problem List   Diagnosis Date Noted  . Benign essential HTN 04/14/2015  . Adiposity 04/14/2015  . Borderline diabetes 04/14/2015    No Known Allergies  Past Surgical History:  Procedure Laterality Date  . CATARACT EXTRACTION, BILATERAL Bilateral 10/2016 and 11/2016  . EYE SURGERY      Social History   Tobacco Use  . Smoking status: Former Smoker    Packs/day: 0.25    Years: 20.00    Pack years: 5.00    Types: Cigarettes  . Smokeless tobacco: Never Used  Vaping Use  . Vaping Use: Never used  Substance Use Topics  . Alcohol use: No  . Drug use: No     Medication list has been  reviewed and updated.  Current Meds  Medication Sig  . ibuprofen (ADVIL) 600 MG tablet Take 1 tablet (600 mg total) by mouth every 6 (six) hours as needed.  . latanoprost (XALATAN) 0.005 % ophthalmic solution Place 1 drop into  both eyes at bedtime.  Marland Kitchen lisinopril (ZESTRIL) 5 MG tablet TAKE 1 TABLET BY MOUTH EVERY DAY  . timolol (BETIMOL) 0.5 % ophthalmic solution Place 1 drop into the right eye daily.    PHQ 2/9 Scores 01/06/2021 02/24/2020 11/30/2019 09/25/2019  PHQ - 2 Score 0 0 0 0  PHQ- 9 Score 0 0 - 0    GAD 7 : Generalized Anxiety Score 01/06/2021 02/24/2020  Nervous, Anxious, on Edge 0 0  Control/stop worrying 0 0  Worry too much - different things 0 0  Trouble relaxing 0 0  Restless 0 0  Easily annoyed or irritable 0 0  Afraid - awful might happen 0 0  Total GAD 7 Score 0 0    BP Readings from Last 3 Encounters:  01/06/21 130/90  10/13/20 (!) 155/83  02/24/20 130/70    Physical Exam Vitals and nursing note reviewed.  Constitutional:      Appearance: She is well-developed and well-nourished.  HENT:     Head: Normocephalic.     Right Ear: Tympanic membrane and external ear normal.     Left Ear: Tympanic membrane and external ear normal.     Mouth/Throat:     Mouth: Oropharynx is clear and moist.  Eyes:     General: Lids are everted, no foreign bodies appreciated. No scleral icterus.       Left eye: No foreign body or hordeolum.     Extraocular Movements: EOM normal.     Conjunctiva/sclera: Conjunctivae normal.     Right eye: Right conjunctiva is not injected.     Left eye: Left conjunctiva is not injected.     Pupils: Pupils are equal, round, and reactive to light.  Neck:     Thyroid: No thyromegaly.     Vascular: No JVD.     Trachea: No tracheal deviation.  Cardiovascular:     Rate and Rhythm: Normal rate and regular rhythm.     Pulses: Intact distal pulses.     Heart sounds: Normal heart sounds. No murmur heard. No friction rub. No gallop.   Pulmonary:     Effort: Pulmonary effort is normal. No respiratory distress.     Breath sounds: Normal breath sounds. No wheezing or rales.  Abdominal:     General: Bowel sounds are normal.     Palpations: Abdomen is soft. There is no  hepatosplenomegaly or mass.     Tenderness: There is no abdominal tenderness. There is no guarding or rebound.  Musculoskeletal:        General: No tenderness or edema. Normal range of motion.     Cervical back: Normal range of motion and neck supple.  Lymphadenopathy:     Cervical: No cervical adenopathy.  Skin:    General: Skin is warm.     Findings: No rash.  Neurological:     Mental Status: She is alert and oriented to person, place, and time.     Cranial Nerves: No cranial nerve deficit.     Deep Tendon Reflexes: Strength normal. Reflexes normal.  Psychiatric:        Mood and Affect: Mood and affect normal. Mood is not anxious or depressed.     Wt Readings from Last 3 Encounters:  01/06/21 198  lb (89.8 kg)  02/24/20 193 lb (87.5 kg)  11/30/19 192 lb 9.6 oz (87.4 kg)    BP 130/90   Pulse 60   Ht 5\' 4"  (1.626 m)   Wt 198 lb (89.8 kg)   BMI 33.99 kg/m   Assessment and Plan: 1. Benign essential HTN Chronic.  Controlled.  Stable.  Blood pressure today is 130/84.  We will continue lisinopril 5 mg once a day.  Will check renal function panel for electrolytes and GFR status. - Renal Function Panel - lisinopril (ZESTRIL) 5 MG tablet; Take 1 tablet (5 mg total) by mouth daily.  Dispense: 90 tablet; Refill: 1  2. Primary osteoarthritis of both knees Chronic.  Persistent.  Stable.  Will refill ibuprofen 600 mg 1 every 6 8 hours as needed severe pain may use Tylenol as her base pain control. - ibuprofen (ADVIL) 600 MG tablet; Take 1 tablet (600 mg total) by mouth every 6 (six) hours as needed.  Dispense: 30 tablet; Refill: 0  3. BMI 33.0-33.9,adult Chronic.  Persistent.  Stable.  Will check lipid panel for current status of LDL level. - Lipid Panel With LDL/HDL Ratio

## 2021-01-07 LAB — RENAL FUNCTION PANEL
Albumin: 4.4 g/dL (ref 3.7–4.7)
BUN/Creatinine Ratio: 19 (ref 12–28)
BUN: 14 mg/dL (ref 8–27)
CO2: 26 mmol/L (ref 20–29)
Calcium: 9.5 mg/dL (ref 8.7–10.3)
Chloride: 104 mmol/L (ref 96–106)
Creatinine, Ser: 0.73 mg/dL (ref 0.57–1.00)
GFR calc Af Amer: 95 mL/min/{1.73_m2} (ref >59)
GFR calc non Af Amer: 83 mL/min/{1.73_m2} (ref >59)
Glucose: 87 mg/dL (ref 65–99)
Phosphorus: 3.7 mg/dL (ref 3.0–4.3)
Potassium: 4.8 mmol/L (ref 3.5–5.2)
Sodium: 141 mmol/L (ref 134–144)

## 2021-01-07 LAB — LIPID PANEL WITH LDL/HDL RATIO
Cholesterol, Total: 178 mg/dL (ref 100–199)
HDL: 50 mg/dL (ref >39)
LDL Chol Calc (NIH): 114 mg/dL — ABNORMAL HIGH (ref 0–99)
LDL/HDL Ratio: 2.3 ratio (ref 0.0–3.2)
Triglycerides: 77 mg/dL (ref 0–149)
VLDL Cholesterol Cal: 14 mg/dL (ref 5–40)

## 2021-02-06 ENCOUNTER — Other Ambulatory Visit: Payer: Self-pay

## 2021-02-06 ENCOUNTER — Ambulatory Visit (INDEPENDENT_AMBULATORY_CARE_PROVIDER_SITE_OTHER): Payer: BC Managed Care – PPO

## 2021-02-06 VITALS — BP 142/82 | HR 77 | Temp 98.0°F | Resp 16 | Ht 64.0 in | Wt 195.8 lb

## 2021-02-06 DIAGNOSIS — Z1231 Encounter for screening mammogram for malignant neoplasm of breast: Secondary | ICD-10-CM

## 2021-02-06 DIAGNOSIS — Z780 Asymptomatic menopausal state: Secondary | ICD-10-CM | POA: Diagnosis not present

## 2021-02-06 DIAGNOSIS — Z0000 Encounter for general adult medical examination without abnormal findings: Secondary | ICD-10-CM | POA: Diagnosis not present

## 2021-02-06 NOTE — Patient Instructions (Signed)
Catherine Pena , Thank you for taking time to come for your Medicare Wellness Visit. I appreciate your ongoing commitment to your health goals. Please review the following plan we discussed and let me know if I can assist you in the future.   Screening recommendations/referrals: Colonoscopy: due. Please contact us for new referral to gastroenterology when you are ready. Mammogram: Please call [phone] to schedule your mammogram and bone density screening Bone Density: done 11/12/17 Recommended yearly ophthalmology/optometry visit for glaucoma screening and checkup Recommended yearly dental visit for hygiene and checkup  Vaccinations: Influenza vaccine: due Pneumococcal vaccine: done 12/08/19 Tdap vaccine: due Shingles vaccine: Shingrix discussed. Please contact your pharmacy for coverage information.  Covid-19: done 02/20/20, 03/19/20 & 11/04/20  Advanced directives: Advance directive discussed with you today. I have provided a copy for you to complete at home and have notarized. Once this is complete please bring a copy in to our office so we can scan it into your chart.  Conditions/risks identified: Recommend increasing physical activity to at least 3 days per week   Next appointment: Follow up in one year for your annual wellness visit    Preventive Care 65 Years and Older, Female Preventive care refers to lifestyle choices and visits with your health care provider that can promote health and wellness. What does preventive care include?  A yearly physical exam. This is also called an annual well check.  Dental exams once or twice a year.  Routine eye exams. Ask your health care provider how often you should have your eyes checked.  Personal lifestyle choices, including:  Daily care of your teeth and gums.  Regular physical activity.  Eating a healthy diet.  Avoiding tobacco and drug use.  Limiting alcohol use.  Practicing safe sex.  Taking low-dose aspirin every  day.  Taking vitamin and mineral supplements as recommended by your health care provider. What happens during an annual well check? The services and screenings done by your health care provider during your annual well check will depend on your age, overall health, lifestyle risk factors, and family history of disease. Counseling  Your health care provider may ask you questions about your:  Alcohol use.  Tobacco use.  Drug use.  Emotional well-being.  Home and relationship well-being.  Sexual activity.  Eating habits.  History of falls.  Memory and ability to understand (cognition).  Work and work Astronomer.  Reproductive health. Screening  You may have the following tests or measurements:  Height, weight, and BMI.  Blood pressure.  Lipid and cholesterol levels. These may be checked every 5 years, or more frequently if you are over 72 years old.  Skin check.  Lung cancer screening. You may have this screening every year starting at age 95 if you have a 30-pack-year history of smoking and currently smoke or have quit within the past 15 years.  Fecal occult blood test (FOBT) of the stool. You may have this test every year starting at age 43.  Flexible sigmoidoscopy or colonoscopy. You may have a sigmoidoscopy every 5 years or a colonoscopy every 10 years starting at age 26.  Hepatitis C blood test.  Hepatitis B blood test.  Sexually transmitted disease (STD) testing.  Diabetes screening. This is done by checking your blood sugar (glucose) after you have not eaten for a while (fasting). You may have this done every 1-3 years.  Bone density scan. This is done to screen for osteoporosis. You may have this done starting at age 29.  Mammogram. This may be done every 1-2 years. Talk to your health care provider about how often you should have regular mammograms. Talk with your health care provider about your test results, treatment options, and if necessary, the need  for more tests. Vaccines  Your health care provider may recommend certain vaccines, such as:  Influenza vaccine. This is recommended every year.  Tetanus, diphtheria, and acellular pertussis (Tdap, Td) vaccine. You may need a Td booster every 10 years.  Zoster vaccine. You may need this after age 26.  Pneumococcal 13-valent conjugate (PCV13) vaccine. One dose is recommended after age 28.  Pneumococcal polysaccharide (PPSV23) vaccine. One dose is recommended after age 72. Talk to your health care provider about which screenings and vaccines you need and how often you need them. This information is not intended to replace advice given to you by your health care provider. Make sure you discuss any questions you have with your health care provider. Document Released: 01/06/2016 Document Revised: 08/29/2016 Document Reviewed: 10/11/2015 Elsevier Interactive Patient Education  2017 [employer] Prevention in the Home Falls can cause injuries. They can happen to people of all ages. There are many things you can do to make your home safe and to help prevent falls. What can I do on the outside of my home?  Regularly fix the edges of walkways and driveways and fix any cracks.  Remove anything that might make you trip as you walk through a door, such as a raised step or threshold.  Trim any bushes or trees on the path to your home.  Use bright outdoor lighting.  Clear any walking paths of anything that might make someone trip, such as rocks or tools.  Regularly check to see if handrails are loose or broken. Make sure that both sides of any steps have handrails.  Any raised decks and porches should have guardrails on the edges.  Have any leaves, snow, or ice cleared regularly.  Use sand or salt on walking paths during winter.  Clean up any spills in your garage right away. This includes oil or grease spills. What can I do in the bathroom?  Use night lights.  Install grab bars  by the toilet and in the tub and shower. Do not use towel bars as grab bars.  Use non-skid mats or decals in the tub or shower.  If you need to sit down in the shower, use a plastic, non-slip stool.  Keep the floor dry. Clean up any water that spills on the floor as soon as it happens.  Remove soap buildup in the tub or shower regularly.  Attach bath mats securely with double-sided non-slip rug tape.  Do not have throw rugs and other things on the floor that can make you trip. What can I do in the bedroom?  Use night lights.  Make sure that you have a light by your bed that is easy to reach.  Do not use any sheets or blankets that are too big for your bed. They should not hang down onto the floor.  Have a firm chair that has side arms. You can use this for support while you get dressed.  Do not have throw rugs and other things on the floor that can make you trip. What can I do in the kitchen?  Clean up any spills right away.  Avoid walking on wet floors.  Keep items that you use a lot in easy-to-reach places.  If you need to reach  something above you, use a strong step stool that has a grab bar.  Keep electrical cords out of the way.  Do not use floor polish or wax that makes floors slippery. If you must use wax, use non-skid floor wax.  Do not have throw rugs and other things on the floor that can make you trip. What can I do with my stairs?  Do not leave any items on the stairs.  Make sure that there are handrails on both sides of the stairs and use them. Fix handrails that are broken or loose. Make sure that handrails are as long as the stairways.  Check any carpeting to make sure that it is firmly attached to the stairs. Fix any carpet that is loose or worn.  Avoid having throw rugs at the top or bottom of the stairs. If you do have throw rugs, attach them to the floor with carpet tape.  Make sure that you have a light switch at the top of the stairs and the  bottom of the stairs. If you do not have them, ask someone to add them for you. What else can I do to help prevent falls?  Wear shoes that:  Do not have high heels.  Have rubber bottoms.  Are comfortable and fit you well.  Are closed at the toe. Do not wear sandals.  If you use a stepladder:  Make sure that it is fully opened. Do not climb a closed stepladder.  Make sure that both sides of the stepladder are locked into place.  Ask someone to hold it for you, if possible.  Clearly mark and make sure that you can see:  Any grab bars or handrails.  First and last steps.  Where the edge of each step is.  Use tools that help you move around (mobility aids) if they are needed. These include:  Canes.  Walkers.  Scooters.  Crutches.  Turn on the lights when you go into a dark area. Replace any light bulbs as soon as they burn out.  Set up your furniture so you have a clear path. Avoid moving your furniture around.  If any of your floors are uneven, fix them.  If there are any pets around you, be aware of where they are.  Review your medicines with your doctor. Some medicines can make you feel dizzy. This can increase your chance of falling. Ask your doctor what other things that you can do to help prevent falls. This information is not intended to replace advice given to you by your health care provider. Make sure you discuss any questions you have with your health care provider. Document Released: 10/06/2009 Document Revised: 05/17/2016 Document Reviewed: 01/14/2015 Elsevier Interactive Patient Education  2017 [employer].

## 2021-02-06 NOTE — Progress Notes (Signed)
Subjective:   Catherine Pena is a 75 y.o. female who presents for Medicare Annual (Subsequent) preventive examination.  Review of Systems     Cardiac Risk Factors include: advanced age (>61men, >38 women);hypertension;obesity (BMI >30kg/m2)     Objective:    Today's Vitals   02/06/21 1134  BP: (!) 142/82  Pulse: 77  Resp: 16  Temp: 98 F (36.7 C)  TempSrc: Oral  SpO2: 97%  Weight: 195 lb 12.8 oz (88.8 kg)  Height: 5\' 4"  (1.626 m)  PainSc: 3    Body mass index is 33.61 kg/m.  Advanced Directives 02/06/2021 11/30/2019 11/10/2018 01/12/2018 11/07/2017  Does Patient Have a Medical Advance Directive? No No No No No  Does patient want to make changes to medical advance directive? - - Yes (MAU/Ambulatory/Procedural Areas - Information given) - -  Would patient like information on creating a medical advance directive? Yes (MAU/Ambulatory/Procedural Areas - Information given) Yes (MAU/Ambulatory/Procedural Areas - Information given) - - Yes (MAU/Ambulatory/Procedural Areas - Information given)    Current Medications (verified) Outpatient Encounter Medications as of 02/06/2021  Medication Sig  . ibuprofen (ADVIL) 600 MG tablet Take 1 tablet (600 mg total) by mouth every 6 (six) hours as needed.  . latanoprost (XALATAN) 0.005 % ophthalmic solution Place 1 drop into both eyes at bedtime.  02/08/2021 lisinopril (ZESTRIL) 5 MG tablet Take 1 tablet (5 mg total) by mouth daily.  . timolol (BETIMOL) 0.5 % ophthalmic solution Place 1 drop into the right eye daily.   No facility-administered encounter medications on file as of 02/06/2021.    Allergies (verified) Patient has no known allergies.   History: Past Medical History:  Diagnosis Date  . Glaucoma   . Hypertension    Past Surgical History:  Procedure Laterality Date  . CATARACT EXTRACTION, BILATERAL Bilateral 10/2016 and 11/2016  . EYE SURGERY     Family History  Problem Relation Age of Onset  . Ovarian cancer Mother   . Diabetes  Father    Social History   Socioeconomic History  . Marital status: Single    Spouse name: Not on file  . Number of children: 3  . Years of education: high school graduate  . Highest education level: High school graduate  Occupational History  . Occupation: 12/2016  Tobacco Use  . Smoking status: Former Smoker    Packs/day: 0.25    Years: 20.00    Pack years: 5.00    Types: Cigarettes  . Smokeless tobacco: Never Used  Vaping Use  . Vaping Use: Never used  Substance and Sexual Activity  . Alcohol use: No  . Drug use: No  . Sexual activity: Not Currently  Other Topics Concern  . Not on file  Social History Narrative   Pt lives with daughter and son in law   Social Determinants of Health   Financial Resource Strain: Low Risk   . Difficulty of Paying Living Expenses: Not hard at all  Food Insecurity: No Food Insecurity  . Worried About Midwife in the Last Year: Never true  . Ran Out of Food in the Last Year: Never true  Transportation Needs: No Transportation Needs  . Lack of Transportation (Medical): No  . Lack of Transportation (Non-Medical): No  Physical Activity: Inactive  . Days of Exercise per Week: 0 days  . Minutes of Exercise per Session: 0 min  Stress: No Stress Concern Present  . Feeling of Stress : Not at all  Social Connections: Moderately Isolated  .  Frequency of Communication with Friends and Family: More than three times a week  . Frequency of Social Gatherings with Friends and Family: Twice a week  . Attends Religious Services: More than 4 times per year  . Active Member of Clubs or Organizations: No  . Attends BankerClub or Organization Meetings: Never  . Marital Status: Never married    Tobacco Counseling Counseling given: Not Answered   Clinical Intake:  Pre-visit preparation completed: Yes  Pain : 0-10 Pain Score: 3  Pain Type: Chronic pain Pain Location: Knee Pain Orientation: Right,Left Pain Descriptors / Indicators:  Aching,Sore Pain Onset: More than a month ago Pain Frequency: Intermittent     BMI - recorded: 33.61 Nutritional Status: BMI > 30  Obese Nutritional Risks: None Diabetes: No  How often do you need to have someone help you when you read instructions, pamphlets, or other written materials from your doctor or pharmacy?: 1 - Never    Interpreter Needed?: No  Information entered by :: Reather LittlerKasey Aris Moman LPN   Activities of Daily Living In your present state of health, do you have any difficulty performing the following activities: 02/06/2021 01/06/2021  Hearing? N N  Comment declines hearing aids -  Vision? N N  Difficulty concentrating or making decisions? N N  Walking or climbing stairs? N N  Dressing or bathing? N N  Doing errands, shopping? N N  Preparing Food and eating ? N -  Using the Toilet? N -  In the past six months, have you accidently leaked urine? N -  Do you have problems with loss of bowel control? N -  Managing your Medications? N -  Managing your Finances? N -  Housekeeping or managing your Housekeeping? N -  Some recent data might be hidden    Patient Care Team: Duanne LimerickJones, Deanna C, MD as PCP - General (Family Medicine)  Indicate any recent Medical Services you may have received from other than [facility] providers in the past year (date may be approximate).     Assessment:   This is a routine wellness examination for Catherine Pena.  Hearing/Vision screen  Hearing Screening   125Hz  250Hz  500Hz  1000Hz  2000Hz  3000Hz  4000Hz  6000Hz  8000Hz   Right ear:           Left ear:           Comments: Pt denies hearing difficulty   Vision Screening Comments: Annual vision screenings with Dr. Larence PenningNice  Dietary issues and exercise activities discussed: Current Exercise Habits: The patient does not participate in regular exercise at present, Exercise limited by: orthopedic condition(s)  Goals    . Exercise 150 minutes per week (moderate activity)     Try to increase exercise to 3 days per  week, 45 minutes OR exercise 5 times per week for 30 minutes      Depression Screen PHQ 2/9 Scores 02/06/2021 01/06/2021 02/24/2020 11/30/2019 09/25/2019 12/02/2018 11/10/2018  PHQ - 2 Score 0 0 0 0 0 0 0  PHQ- 9 Score - 0 0 - 0 0 -    Fall Risk Fall Risk  02/06/2021 02/24/2020 11/30/2019 12/02/2018 11/10/2018  Falls in the past year? 0 0 0 0 0  Number falls in past yr: 0 - 0 - -  Injury with Fall? 0 - 0 - -  Risk for fall due to : No Fall Risks - - - -  Follow up Falls prevention discussed Falls evaluation completed Falls prevention discussed Falls evaluation completed -    FALL RISK PREVENTION PERTAINING  TO THE HOME:  Any stairs in or around the home? Yes  If so, are there any without handrails? No  Home free of loose throw rugs in walkways, pet beds, electrical cords, etc? Yes  Adequate lighting in your home to reduce risk of falls? Yes   ASSISTIVE DEVICES UTILIZED TO PREVENT FALLS:  Life alert? No  Use of a cane, walker or w/c? No  Grab bars in the bathroom? Yes  Shower chair or bench in shower? No  Elevated toilet seat or a handicapped toilet? No   TIMED UP AND GO:  Was the test performed? Yes .  Length of time to ambulate 10 feet: 5 sec.   Gait steady and fast without use of assistive device  Cognitive Function: Normal cognitive status assessed by direct observation by this Nurse Health Advisor. No abnormalities found.       6CIT Screen 11/30/2019 11/07/2017  What Year? 0 points 0 points  What month? 0 points 0 points  What time? 0 points 0 points  Count back from 20 0 points 0 points  Months in reverse 0 points 0 points  Repeat phrase 0 points 0 points  Total Score 0 0    Immunizations Immunization History  Administered Date(s) Administered  . Influenza-Unspecified 10/07/2017, 10/03/2019  . Moderna Sars-Covid-2 Vaccination 02/20/2020, 03/19/2020, 11/04/2020  . Pneumococcal Conjugate-13 12/02/2018  . Pneumococcal Polysaccharide-23 12/08/2019    TDAP status:  Due, Education has been provided regarding the importance of this vaccine. Advised may receive this vaccine at local pharmacy or Health Dept. Aware to provide a copy of the vaccination record if obtained from local pharmacy or Health Dept. Verbalized acceptance and understanding.  Flu Vaccine status: Due, Education has been provided regarding the importance of this vaccine. Advised may receive this vaccine at local pharmacy or Health Dept. Aware to provide a copy of the vaccination record if obtained from local pharmacy or Health Dept. Verbalized acceptance and understanding.  Pneumococcal vaccine status: Up to date  Covid-19 vaccine status: Completed vaccines  Qualifies for Shingles Vaccine? Yes   Zostavax completed No   Shingrix Completed?: No.    Education has been provided regarding the importance of this vaccine. Patient has been advised to call insurance company to determine out of pocket expense if they have not yet received this vaccine. Advised may also receive vaccine at local pharmacy or Health Dept. Verbalized acceptance and understanding.  Screening Tests Health Maintenance  Topic Date Due  . Hepatitis C Screening  Never done  . COLONOSCOPY (Pts 45-88yrs Insurance coverage will need to be confirmed)  Never done  . MAMMOGRAM  Never done  . INFLUENZA VACCINE  07/24/2020  . TETANUS/TDAP  12/24/2021 (Originally 03/24/1965)  . DEXA SCAN  Completed  . COVID-19 Vaccine  Completed  . PNA vac Low Risk Adult  Completed    Health Maintenance  Health Maintenance Due  Topic Date Due  . Hepatitis C Screening  Never done  . COLONOSCOPY (Pts 45-6yrs Insurance coverage will need to be confirmed)  Never done  . MAMMOGRAM  Never done  . INFLUENZA VACCINE  07/24/2020    Colorectal cancer screening: Referral to GI placed 11/30/19. Pt aware the office will call re: appt. Pti declined repeat referral and states she will contact us when ready for colonoscopy  Mammogram status: Ordered today.  Pt provided with contact info and advised to call to schedule appt.   Bone Density status: Completed 11/12/17. Results reflect: Bone density results: NORMAL. Repeat every 2  years. Ordered today   Lung Cancer Screening: (Low Dose CT Chest recommended if Age 18-80 years, 30 pack-year currently smoking OR have quit w/in 15years.) does not qualify.   Additional Screening:  Hepatitis C Screening: does qualify; postponed  Vision Screening: Recommended annual ophthalmology exams for early detection of glaucoma and other disorders of the eye. Is the patient up to date with their annual eye exam?  Yes  Who is the provider or what is the name of the office in which the patient attends annual eye exams? Dr. Larence Penning  Dental Screening: Recommended annual dental exams for proper oral hygiene  Community Resource Referral / Chronic Care Management: CRR required this visit?  No   CCM required this visit?  No      Plan:     I have personally reviewed and noted the following in the patient's chart:   . Medical and social history . Use of alcohol, tobacco or illicit drugs  . Current medications and supplements . Functional ability and status . Nutritional status . Physical activity . Advanced directives . List of other physicians . Hospitalizations, surgeries, and ER visits in previous 12 months . Vitals . Screenings to include cognitive, depression, and falls . Referrals and appointments  In addition, I have reviewed and discussed with patient certain preventive protocols, quality metrics, and best practice recommendations. A written personalized care plan for preventive services as well as general preventive health recommendations were provided to patient.     Reather Littler, LPN   [id number]   Nurse Notes: pt requested handicap placard for parking due to arthritis in knees. Pt advised per Dr. Yetta Barre would need additional diagnosis & walking difficulty to qualify. Pt states she has to walk a  long distance at work from the parking lot and may just need a note for work to be able to park closer; advised to discuss with Dr. Yetta Barre at next visit.

## 2021-02-28 DIAGNOSIS — Z961 Presence of intraocular lens: Secondary | ICD-10-CM | POA: Diagnosis not present

## 2021-02-28 DIAGNOSIS — H43812 Vitreous degeneration, left eye: Secondary | ICD-10-CM | POA: Diagnosis not present

## 2021-02-28 DIAGNOSIS — H401131 Primary open-angle glaucoma, bilateral, mild stage: Secondary | ICD-10-CM | POA: Diagnosis not present

## 2021-03-01 ENCOUNTER — Other Ambulatory Visit: Payer: BC Managed Care – PPO

## 2021-04-22 ENCOUNTER — Other Ambulatory Visit: Payer: Self-pay

## 2021-04-22 ENCOUNTER — Ambulatory Visit
Admission: EM | Admit: 2021-04-22 | Discharge: 2021-04-22 | Disposition: A | Discharge status: Home / Self Care | Payer: BC Managed Care – PPO | Attending: Sports Medicine | Admitting: Sports Medicine

## 2021-04-22 DIAGNOSIS — R062 Wheezing: Secondary | ICD-10-CM

## 2021-04-22 DIAGNOSIS — J209 Acute bronchitis, unspecified: Secondary | ICD-10-CM

## 2021-04-22 DIAGNOSIS — J302 Other seasonal allergic rhinitis: Secondary | ICD-10-CM

## 2021-04-22 DIAGNOSIS — R059 Cough, unspecified: Secondary | ICD-10-CM | POA: Diagnosis not present

## 2021-04-22 DIAGNOSIS — R0989 Other specified symptoms and signs involving the circulatory and respiratory systems: Secondary | ICD-10-CM

## 2021-04-22 DIAGNOSIS — R0981 Nasal congestion: Secondary | ICD-10-CM | POA: Diagnosis not present

## 2021-04-22 DIAGNOSIS — J069 Acute upper respiratory infection, unspecified: Secondary | ICD-10-CM | POA: Diagnosis not present

## 2021-04-22 MED ORDER — AZITHROMYCIN 250 MG PO TABS: 250.0000 mg | ORAL_TABLET | Freq: Every day | ORAL | 0 refills | Status: DC

## 2021-04-22 MED ORDER — BENZONATATE 100 MG PO CAPS: 100.0000 mg | ORAL_CAPSULE | Freq: Three times a day (TID) | ORAL | 0 refills | Status: DC

## 2021-04-22 MED ORDER — ALBUTEROL SULFATE HFA 108 (90 BASE) MCG/ACT IN AERS: 1.0000 | INHALATION_SPRAY | Freq: Four times a day (QID) | RESPIRATORY_TRACT | 0 refills | Status: DC | PRN

## 2021-04-22 MED ORDER — PREDNISONE 10 MG PO TABS: 20.0000 mg | ORAL_TABLET | Freq: Every day | ORAL | 0 refills | Status: DC

## 2021-04-22 NOTE — ED Triage Notes (Signed)
Patient states that she has been having a cough with chest congestion, headaches. States that she did do a home test for covid and was negative.

## 2021-04-22 NOTE — ED Provider Notes (Signed)
[facility]    CSN: [id number] Arrival date & time: 04/22/21  1221      History   Chief Complaint Chief Complaint  Patient presents with  . Cough    HPI Catherine Pena is a 75 y.o. female.   Patient is a pleasant 75 year old female who presents for evaluation of the above issues.  Normally sees Dr. Elizabeth Sauer for ongoing medical care.  Her doctor was not available today and she comes into the urgent care.  She says that she has had 1 week of cough that is been mildly productive, chest congestion, nasal congestion, and a mild headache.  She denies any ear pain, sore throat, fever shakes chills, or nausea vomiting or diarrhea.  She denies any abdominal or urinary symptoms.  She has noted a little bit of a wheeze.  No chest pain or shortness of breath.  She did have a negative COVID test at home last night.  She has been vaccinated and has received a booster against COVID-19.  She has received her flu shot.  No COVID exposure or COVID history.  No history of asthma or COPD.  She is not a smoker.  She works in textile's.  She does have a history of seasonal allergies and takes Claritin.  No red flag signs or symptoms elicited on history.     Past Medical History:  Diagnosis Date  . Glaucoma   . Hypertension     Patient Active Problem List   Diagnosis Date Noted  . Benign essential HTN 04/14/2015  . Adiposity 04/14/2015  . Borderline diabetes 04/14/2015    Past Surgical History:  Procedure Laterality Date  . CATARACT EXTRACTION, BILATERAL Bilateral 10/2016 and 11/2016  . EYE SURGERY      OB History   No obstetric history on file.      Home Medications    Prior to Admission medications   Medication Sig Start Date End Date Taking? Authorizing Provider  albuterol (VENTOLIN HFA) 108 (90 Base) MCG/ACT inhaler Inhale 1-2 puffs into the lungs every 6 (six) hours as needed for wheezing or shortness of breath. 04/22/21  Yes Delton See, MD  azithromycin  (ZITHROMAX) 250 MG tablet Take 1 tablet (250 mg total) by mouth daily. Take first 2 tablets together, then 1 every day until finished. 04/22/21  Yes Delton See, MD  benzonatate (TESSALON) 100 MG capsule Take 1 capsule (100 mg total) by mouth every 8 (eight) hours. 04/22/21  Yes Delton See, MD  ibuprofen (ADVIL) 600 MG tablet Take 1 tablet (600 mg total) by mouth every 6 (six) hours as needed. 01/06/21  Yes Duanne Limerick, MD  latanoprost (XALATAN) 0.005 % ophthalmic solution Place 1 drop into both eyes at bedtime. 09/30/17  Yes [provider]  lisinopril (ZESTRIL) 5 MG tablet Take 1 tablet (5 mg total) by mouth daily. 01/06/21  Yes Duanne Limerick, MD  predniSONE (DELTASONE) 10 MG tablet Take 2 tablets (20 mg total) by mouth daily with breakfast. 04/22/21  Yes Delton See, MD  timolol (BETIMOL) 0.5 % ophthalmic solution Place 1 drop into the right eye daily.   Yes [provider]    Family History Family History  Problem Relation Age of Onset  . Ovarian cancer Mother   . Diabetes Father     Social History Social History   Tobacco Use  . Smoking status: Former Smoker    Packs/day: 0.25    Years: 20.00    Pack years: 5.00  Types: Cigarettes  . Smokeless tobacco: Never Used  Vaping Use  . Vaping Use: Never used  Substance Use Topics  . Alcohol use: No  . Drug use: No     Allergies   Patient has no known allergies.   Review of Systems Review of Systems  Constitutional: Negative.  Negative for chills, diaphoresis and fever.  HENT: Positive for congestion. Negative for ear pain, postnasal drip, rhinorrhea, sinus pressure, sinus pain and sore throat.   Eyes: Negative.  Negative for pain.  Respiratory: Positive for cough, chest tightness and wheezing. Negative for shortness of breath.   Cardiovascular: Negative for chest pain and palpitations.  Gastrointestinal: Negative.  Negative for abdominal pain, constipation, diarrhea, nausea and vomiting.   Genitourinary: Negative.  Negative for dysuria, flank pain, frequency and urgency.  Musculoskeletal: Negative.  Negative for arthralgias, back pain, myalgias, neck pain and neck stiffness.  Skin: Negative.  Negative for color change, pallor, rash and wound.  Neurological: Positive for headaches. Negative for dizziness, syncope, weakness, light-headedness and numbness.     Physical Exam Triage Vital Signs ED Triage Vitals  Enc Vitals Group     BP 04/22/21 1238 (!) 141/78     Pulse Rate 04/22/21 1238 62     Resp 04/22/21 1238 18     Temp 04/22/21 1238 97.9 F (36.6 C)     Temp Source 04/22/21 1238 Oral     SpO2 04/22/21 1238 96 %     Weight 04/22/21 1237 195 lb (88.5 kg)     Height 04/22/21 1237 5\' 4"  (1.626 m)     Head Circumference --      Peak Flow --      Pain Score 04/22/21 1236 7     Pain Loc --      Pain Edu? --      Excl. in GC? --    No data found.  Updated Vital Signs BP (!) 141/78 (BP Location: Right Arm)   Pulse 62   Temp 97.9 F (36.6 C) (Oral)   Resp 18   Ht 5\' 4"  (1.626 m)   Wt 88.5 kg   SpO2 96%   BMI 33.47 kg/m   Visual Acuity Right Eye Distance:   Left Eye Distance:   Bilateral Distance:    Right Eye Near:   Left Eye Near:    Bilateral Near:     Physical Exam Vitals and nursing note reviewed.  Constitutional:      General: She is not in acute distress.    Appearance: Normal appearance. She is not ill-appearing, toxic-appearing or diaphoretic.  HENT:     Head: Normocephalic and atraumatic.     Right Ear: Tympanic membrane normal.     Left Ear: Tympanic membrane normal.     Nose: Congestion present. No rhinorrhea.     Mouth/Throat:     Mouth: Mucous membranes are moist.     Pharynx: No oropharyngeal exudate or posterior oropharyngeal erythema.  Eyes:     General: No scleral icterus.       Right eye: No discharge.        Left eye: No discharge.     Extraocular Movements: Extraocular movements intact.     Conjunctiva/sclera:  Conjunctivae normal.     Pupils: Pupils are equal, round, and reactive to light.  Cardiovascular:     Rate and Rhythm: Normal rate and regular rhythm.     Pulses: Normal pulses.     Heart sounds: Normal heart sounds. No murmur heard.  No friction rub. No gallop.   Pulmonary:     Effort: Pulmonary effort is normal. No tachypnea, accessory muscle usage, respiratory distress or retractions.     Breath sounds: No stridor or decreased air movement. Examination of the right-upper field reveals wheezing. Examination of the left-upper field reveals wheezing. Examination of the right-middle field reveals wheezing. Examination of the left-middle field reveals wheezing. Examination of the right-lower field reveals wheezing. Examination of the left-lower field reveals wheezing. Wheezing present. No decreased breath sounds, rhonchi or rales.  Musculoskeletal:     Cervical back: Normal range of motion and neck supple. No rigidity or tenderness.  Lymphadenopathy:     Cervical: Cervical adenopathy present.  Skin:    General: Skin is warm and dry.     Capillary Refill: Capillary refill takes less than 2 seconds.     Findings: No bruising, erythema, lesion or rash.  Neurological:     General: No focal deficit present.     Mental Status: She is alert and oriented to person, place, and time.      UC Treatments / Results  Labs (all labs ordered are listed, but only abnormal results are displayed) Labs Reviewed - No data to display  EKG   Radiology No results found.  Procedures Procedures (including critical care time)  Medications Ordered in UC Medications - No data to display  Initial Impression / Assessment and Plan / UC Course  I have reviewed the triage vital signs and the nursing notes.  Pertinent labs & imaging results that were available during my care of the patient were reviewed by me and considered in my medical decision making (see chart for details).  Clinical impression: 1 week  of mildly productive cough with chest congestion and nasal congestion.  She is also noted a little bit of wheeze.  Clinically she has bronchitis, with an upper respiratory infection and cough with nasal and chest congestion.  She does have wheeze throughout her lung fields on examination.  She does have a history of seasonal allergies as well.  Treatment plan: 1.  The findings and treatment plan were discussed in detail with the patient.  Patient was in agreement. 2.  We discussed doing a COVID test but she said she had a -1 at home and did not want to have one today. 3.  Given her symptoms including wheeze we will go ahead and prescribe a prednisone taper, Z-Pak, albuterol, and for her cough Tessalon Perles. 4.  Educational handouts provided. 5.  Also recommended over-the-counter Mucinex 1200 mg twice a day. 6.  She requested a work note and one was provided. 7.  If symptoms persist then she should see her primary care provider. 8.  If symptoms worsen then she should go to the emergency room 9.  We will discharge her from care at this time.  She was stable on discharge.  She will follow-up here as needed.    Final Clinical Impressions(s) / UC Diagnoses   Final diagnoses:  Acute bronchitis, unspecified organism  Cough  Upper respiratory tract infection, unspecified type  Nasal congestion  Chest congestion  Wheezing  Seasonal allergies     Discharge Instructions     Please see educational handouts     ED Prescriptions    Medication Sig Dispense Auth. Provider   albuterol (VENTOLIN HFA) 108 (90 Base) MCG/ACT inhaler Inhale 1-2 puffs into the lungs every 6 (six) hours as needed for wheezing or shortness of breath. 1 each Delton See, MD  azithromycin (ZITHROMAX) 250 MG tablet Take 1 tablet (250 mg total) by mouth daily. Take first 2 tablets together, then 1 every day until finished. 6 tablet Delton SeeBarnes, Solace Manwarren, MD   benzonatate (TESSALON) 100 MG capsule Take 1 capsule (100 mg  total) by mouth every 8 (eight) hours. 21 capsule Delton SeeBarnes, Kimo Bancroft, MD   predniSONE (DELTASONE) 10 MG tablet Take 2 tablets (20 mg total) by mouth daily with breakfast. 10 tablet Delton SeeBarnes, Krue Peterka, MD     PDMP not reviewed this encounter.   Delton SeeBarnes, Koleen Celia, MD 04/24/21 1438

## 2021-04-22 NOTE — Discharge Instructions (Signed)
Please see educational handouts 

## 2021-06-06 DIAGNOSIS — Z961 Presence of intraocular lens: Secondary | ICD-10-CM | POA: Diagnosis not present

## 2021-06-06 DIAGNOSIS — H401131 Primary open-angle glaucoma, bilateral, mild stage: Secondary | ICD-10-CM | POA: Diagnosis not present

## 2021-06-06 DIAGNOSIS — H43812 Vitreous degeneration, left eye: Secondary | ICD-10-CM | POA: Diagnosis not present

## 2021-06-23 ENCOUNTER — Ambulatory Visit: Payer: BC Managed Care – PPO | Admitting: Family Medicine

## 2021-06-23 ENCOUNTER — Other Ambulatory Visit: Payer: Self-pay

## 2021-06-23 ENCOUNTER — Encounter: Payer: Self-pay | Admitting: Family Medicine

## 2021-06-23 VITALS — BP 144/98 | HR 74 | Ht 64.0 in | Wt 195.0 lb

## 2021-06-23 DIAGNOSIS — Z1211 Encounter for screening for malignant neoplasm of colon: Secondary | ICD-10-CM

## 2021-06-23 DIAGNOSIS — I10 Essential (primary) hypertension: Secondary | ICD-10-CM

## 2021-06-23 MED ORDER — LISINOPRIL 10 MG PO TABS
10.0000 mg | ORAL_TABLET | Freq: Every day | ORAL | 0 refills | Status: DC
Start: 2021-06-23 — End: 2021-09-22

## 2021-06-23 NOTE — Progress Notes (Signed)
Date:  06/23/2021   Name:  Catherine Pena   DOB:  [birth year]   MRN:  [id number]   Chief Complaint: Hypertension  Hypertension This is a chronic problem. The current episode started more than 1 year ago. The problem is uncontrolled. Pertinent negatives include no anxiety, blurred vision, chest pain, headaches, malaise/fatigue, neck pain, orthopnea, palpitations, peripheral edema, PND, shortness of breath or sweats. There are no associated agents to hypertension. Risk factors for coronary artery disease include dyslipidemia. Past treatments include ACE inhibitors. The current treatment provides moderate improvement. There are no compliance problems.  There is no history of angina, kidney disease, CAD/MI, CVA, heart failure, left ventricular hypertrophy, PVD or retinopathy. There is no history of chronic renal disease, a hypertension causing med or renovascular disease.   Lab Results  Component Value Date   CREATININE 0.73 01/06/2021   BUN 14 01/06/2021   NA 141 01/06/2021   K 4.8 01/06/2021   CL 104 01/06/2021   CO2 26 01/06/2021   Lab Results  Component Value Date   CHOL 178 01/06/2021   HDL 50 01/06/2021   LDLCALC 114 (H) 01/06/2021   TRIG 77 01/06/2021   CHOLHDL 3.2 10/24/2017   No results found for: TSH No results found for: HGBA1C Lab Results  Component Value Date   WBC 5.1 08/06/2016   HGB 13.2 08/06/2016   HCT 37.3 08/06/2016   MCV 82.7 08/06/2016   PLT 137 (L) 08/06/2016   No results found for: ALT, AST, GGT, ALKPHOS, BILITOT   Review of Systems  Constitutional:  Negative for chills, fever and malaise/fatigue.  HENT:  Negative for drooling, ear discharge, ear pain and sore throat.   Eyes:  Negative for blurred vision.  Respiratory:  Negative for cough, shortness of breath and wheezing.   Cardiovascular:  Negative for chest pain, palpitations, orthopnea, leg swelling and PND.  Gastrointestinal:  Negative for abdominal pain, blood in stool, constipation, diarrhea  and nausea.  Endocrine: Negative for polydipsia.  Genitourinary:  Negative for dysuria, frequency, hematuria and urgency.  Musculoskeletal:  Negative for back pain, myalgias and neck pain.  Skin:  Negative for rash.  Allergic/Immunologic: Negative for environmental allergies.  Neurological:  Negative for dizziness and headaches.  Hematological:  Does not bruise/bleed easily.  Psychiatric/Behavioral:  Negative for suicidal ideas. The patient is not nervous/anxious.    Patient Active Problem List   Diagnosis Date Noted   Benign essential HTN 04/14/2015   Adiposity 04/14/2015   Borderline diabetes 04/14/2015    No Known Allergies  Past Surgical History:  Procedure Laterality Date   CATARACT EXTRACTION, BILATERAL Bilateral 10/2016 and 11/2016   EYE SURGERY      Social History   Tobacco Use   Smoking status: Former    Packs/day: 0.25    Years: 20.00    Pack years: 5.00    Types: Cigarettes   Smokeless tobacco: Never  Vaping Use   Vaping Use: Never used  Substance Use Topics   Alcohol use: No   Drug use: No     Medication list has been reviewed and updated.  Current Meds  Medication Sig   albuterol (VENTOLIN HFA) 108 (90 Base) MCG/ACT inhaler Inhale 1-2 puffs into the lungs every 6 (six) hours as needed for wheezing or shortness of breath.   ibuprofen (ADVIL) 600 MG tablet Take 1 tablet (600 mg total) by mouth every 6 (six) hours as needed.   latanoprost (XALATAN) 0.005 % ophthalmic solution Place 1 drop into both  eyes at bedtime.   lisinopril (ZESTRIL) 5 MG tablet Take 1 tablet (5 mg total) by mouth daily.   timolol (BETIMOL) 0.5 % ophthalmic solution Place 1 drop into the right eye daily.    PHQ 2/9 Scores 06/23/2021 02/06/2021 01/06/2021 02/24/2020  PHQ - 2 Score 0 0 0 0  PHQ- 9 Score 0 - 0 0    GAD 7 : Generalized Anxiety Score 06/23/2021 01/06/2021 02/24/2020  Nervous, Anxious, on Edge 0 0 0  Control/stop worrying 0 0 0  Worry too much - different things 0 0 0   Trouble relaxing 0 0 0  Restless 0 0 0  Easily annoyed or irritable 0 0 0  Afraid - awful might happen 0 0 0  Total GAD 7 Score 0 0 0    BP Readings from Last 3 Encounters:  06/23/21 (!) 144/98  04/22/21 (!) 141/78  02/06/21 (!) 142/82    Physical Exam Vitals and nursing note reviewed.  Constitutional:      General: She is not in acute distress.    Appearance: She is not diaphoretic.  HENT:     Head: Normocephalic and atraumatic.     Right Ear: Tympanic membrane, ear canal and external ear normal. There is no impacted cerumen.     Left Ear: Tympanic membrane, ear canal and external ear normal. There is no impacted cerumen.     Nose: Nose normal. No congestion or rhinorrhea.  Eyes:     General:        Right eye: No discharge.        Left eye: No discharge.     Conjunctiva/sclera: Conjunctivae normal.     Pupils: Pupils are equal, round, and reactive to light.  Neck:     Thyroid: No thyromegaly.     Vascular: No JVD.  Cardiovascular:     Rate and Rhythm: Normal rate and regular rhythm.     Heart sounds: Normal heart sounds. No murmur heard.   No friction rub. No gallop.  Pulmonary:     Effort: Pulmonary effort is normal.     Breath sounds: Normal breath sounds. No wheezing or rhonchi.  Abdominal:     General: Bowel sounds are normal.     Palpations: Abdomen is soft. There is no mass.     Tenderness: There is no abdominal tenderness. There is no guarding.  Musculoskeletal:        General: Normal range of motion.     Cervical back: Normal range of motion and neck supple.  Lymphadenopathy:     Cervical: No cervical adenopathy.  Skin:    General: Skin is warm and dry.  Neurological:     Mental Status: She is alert.     Deep Tendon Reflexes: Reflexes are normal and symmetric.    Wt Readings from Last 3 Encounters:  06/23/21 195 lb (88.5 kg)  04/22/21 195 lb (88.5 kg)  02/06/21 195 lb 12.8 oz (88.8 kg)    BP (!) 144/98   Pulse 74   Ht 5\' 4"  (1.626 m)   Wt 195  lb (88.5 kg)   BMI 33.47 kg/m   Assessment and Plan:  1. Benign essential HTN Chronic.  Uncontrolled.  Stable.  Patient just took her medication about an hour ago but her blood pressure today is elevated at 144/98.  We will increase lisinopril to 10 mg once a day.  And will recheck patient in about 8 weeks. - lisinopril (ZESTRIL) 10 MG tablet; Take 1 tablet (10  mg total) by mouth daily.  Dispense: 90 tablet; Refill: 0  2. Colon cancer screening Discussed with patient and referral made to gastroenterology. - Ambulatory referral to Gastroenterology

## 2021-07-12 ENCOUNTER — Encounter: Payer: Self-pay | Admitting: *Deleted

## 2021-08-16 ENCOUNTER — Telehealth: Payer: Self-pay

## 2021-08-16 NOTE — Telephone Encounter (Signed)
Left VM informing pt she needs to schedule her mammo and bone density. Told her to call [phone].

## 2021-09-11 DIAGNOSIS — H43812 Vitreous degeneration, left eye: Secondary | ICD-10-CM | POA: Diagnosis not present

## 2021-09-11 DIAGNOSIS — H401131 Primary open-angle glaucoma, bilateral, mild stage: Secondary | ICD-10-CM | POA: Diagnosis not present

## 2021-09-11 DIAGNOSIS — Z961 Presence of intraocular lens: Secondary | ICD-10-CM | POA: Diagnosis not present

## 2021-09-20 ENCOUNTER — Other Ambulatory Visit: Payer: Self-pay | Admitting: Family Medicine

## 2021-09-20 DIAGNOSIS — I10 Essential (primary) hypertension: Secondary | ICD-10-CM

## 2021-09-20 NOTE — Telephone Encounter (Signed)
Requested medications are due for refill today.  yes  Requested medications are on the active medications list.  yes  Last refill. 06/23/2021  Future visit scheduled.   yes  Notes to clinic.  Labs are expired.

## 2021-10-17 ENCOUNTER — Other Ambulatory Visit: Payer: Self-pay | Admitting: Family Medicine

## 2021-10-17 DIAGNOSIS — I10 Essential (primary) hypertension: Secondary | ICD-10-CM

## 2021-10-17 NOTE — Telephone Encounter (Signed)
Requested Prescriptions  Pending Prescriptions Disp Refills  . lisinopril (ZESTRIL) 10 MG tablet [Pharmacy Med Name: LISINOPRIL 10 MG TABLET] 90 tablet 0    Sig: TAKE 1 TABLET BY MOUTH EVERY DAY     Cardiovascular:  ACE Inhibitors Failed - 10/17/2021  9:33 AM      Failed - Cr in normal range and within 180 days    Creatinine  Date Value Ref Range Status  10/29/2014 0.77 0.60 - 1.30 mg/dL Final   Creatinine, Ser  Date Value Ref Range Status  01/06/2021 0.73 0.57 - 1.00 mg/dL Final         Failed - K in normal range and within 180 days    Potassium  Date Value Ref Range Status  01/06/2021 4.8 3.5 - 5.2 mmol/L Final  10/29/2014 3.9 3.5 - 5.1 mmol/L Final         Failed - Last BP in normal range    BP Readings from Last 1 Encounters:  06/23/21 (!) 144/98         Passed - Patient is not pregnant      Passed - Valid encounter within last 6 months    Recent Outpatient Visits          3 months ago Benign essential HTN   [facility] Duanne Limerick, MD   9 months ago Benign essential HTN   [facility] Duanne Limerick, MD   1 year ago Benign essential HTN   [facility] Duanne Limerick, MD   2 years ago Benign essential HTN   [facility] Duanne Limerick, MD   2 years ago Benign essential HTN   [facility] Duanne Limerick, MD      Future Appointments            In 2 months Duanne Limerick, MD [facility]

## 2021-12-06 DIAGNOSIS — H43812 Vitreous degeneration, left eye: Secondary | ICD-10-CM | POA: Diagnosis not present

## 2021-12-06 DIAGNOSIS — H401131 Primary open-angle glaucoma, bilateral, mild stage: Secondary | ICD-10-CM | POA: Diagnosis not present

## 2021-12-06 DIAGNOSIS — Z961 Presence of intraocular lens: Secondary | ICD-10-CM | POA: Diagnosis not present

## 2021-12-27 ENCOUNTER — Encounter: Payer: Self-pay | Admitting: Family Medicine

## 2021-12-27 ENCOUNTER — Ambulatory Visit: Payer: BC Managed Care – PPO | Admitting: Family Medicine

## 2021-12-27 ENCOUNTER — Other Ambulatory Visit: Payer: Self-pay

## 2021-12-27 VITALS — BP 120/70 | HR 60 | Ht 64.0 in | Wt 193.0 lb

## 2021-12-27 DIAGNOSIS — E782 Mixed hyperlipidemia: Secondary | ICD-10-CM

## 2021-12-27 DIAGNOSIS — I10 Essential (primary) hypertension: Secondary | ICD-10-CM | POA: Diagnosis not present

## 2021-12-27 MED ORDER — LISINOPRIL 10 MG PO TABS: 10.0000 mg | ORAL_TABLET | Freq: Every day | ORAL | 1 refills | Status: DC

## 2021-12-27 NOTE — Progress Notes (Signed)
Date:  12/27/2021   Name:  Catherine Pena   DOB:  July 07, 1946   MRN:  [id number]   Chief Complaint: Hypertension  Hypertension This is a chronic problem. The current episode started more than 1 year ago. The problem has been gradually improving since onset. The problem is controlled. Pertinent negatives include no chest pain, headaches, neck pain, orthopnea, palpitations or shortness of breath. There are no associated agents to hypertension. Past treatments include ACE inhibitors. The current treatment provides mild improvement. There are no compliance problems.  There is no history of angina, kidney disease, CAD/MI, CVA, heart failure, left ventricular hypertrophy, PVD or retinopathy. There is no history of chronic renal disease, a hypertension causing med or renovascular disease.   Lab Results  Component Value Date   NA 141 01/06/2021   K 4.8 01/06/2021   CO2 26 01/06/2021   GLUCOSE 87 01/06/2021   BUN 14 01/06/2021   CREATININE 0.73 01/06/2021   CALCIUM 9.5 01/06/2021   GFRNONAA 83 01/06/2021   Lab Results  Component Value Date   CHOL 178 01/06/2021   HDL 50 01/06/2021   LDLCALC 114 (H) 01/06/2021   TRIG 77 01/06/2021   CHOLHDL 3.2 10/24/2017   No results found for: TSH No results found for: HGBA1C Lab Results  Component Value Date   WBC 5.1 08/06/2016   HGB 13.2 08/06/2016   HCT 37.3 08/06/2016   MCV 82.7 08/06/2016   PLT 137 (L) 08/06/2016   No results found for: ALT, AST, GGT, ALKPHOS, BILITOT No results found for: 25OHVITD2, 25OHVITD3, VD25OH   Review of Systems  Constitutional:  Negative for chills and fever.  HENT:  Negative for drooling, ear discharge, ear pain and sore throat.   Respiratory:  Negative for cough, shortness of breath and wheezing.   Cardiovascular:  Negative for chest pain, palpitations, orthopnea and leg swelling.  Gastrointestinal:  Negative for abdominal pain, blood in stool, constipation, diarrhea and nausea.  Endocrine: Negative for  polydipsia.  Genitourinary:  Negative for dysuria, frequency, hematuria and urgency.  Musculoskeletal:  Negative for back pain, myalgias and neck pain.  Skin:  Negative for rash.  Allergic/Immunologic: Negative for environmental allergies.  Neurological:  Negative for dizziness and headaches.  Hematological:  Does not bruise/bleed easily.  Psychiatric/Behavioral:  Negative for suicidal ideas. The patient is not nervous/anxious.    Patient Active Problem List   Diagnosis Date Noted   Benign essential HTN 04/14/2015   Adiposity 04/14/2015   Borderline diabetes 04/14/2015    No Known Allergies  Past Surgical History:  Procedure Laterality Date   CATARACT EXTRACTION, BILATERAL Bilateral 10/2016 and 11/2016   EYE SURGERY      Social History   Tobacco Use   Smoking status: Former    Packs/day: 0.25    Years: 20.00    Pack years: 5.00    Types: Cigarettes   Smokeless tobacco: Never  Vaping Use   Vaping Use: Never used  Substance Use Topics   Alcohol use: No   Drug use: No     Medication list has been reviewed and updated.  Current Meds  Medication Sig   albuterol (VENTOLIN HFA) 108 (90 Base) MCG/ACT inhaler Inhale 1-2 puffs into the lungs every 6 (six) hours as needed for wheezing or shortness of breath.   ibuprofen (ADVIL) 600 MG tablet Take 1 tablet (600 mg total) by mouth every 6 (six) hours as needed.   latanoprost (XALATAN) 0.005 % ophthalmic solution Place 1 drop into both  eyes at bedtime.   lisinopril (ZESTRIL) 10 MG tablet TAKE 1 TABLET BY MOUTH EVERY DAY   timolol (BETIMOL) 0.5 % ophthalmic solution Place 1 drop into the right eye daily.    PHQ 2/9 Scores 12/27/2021 06/23/2021 02/06/2021 01/06/2021  PHQ - 2 Score 0 0 0 0  PHQ- 9 Score 0 0 - 0    GAD 7 : Generalized Anxiety Score 12/27/2021 06/23/2021 01/06/2021 02/24/2020  Nervous, Anxious, on Edge 0 0 0 0  Control/stop worrying 0 0 0 0  Worry too much - different things 0 0 0 0  Trouble relaxing 0 0 0 0  Restless 0  0 0 0  Easily annoyed or irritable 0 0 0 0  Afraid - awful might happen 0 0 0 0  Total GAD 7 Score 0 0 0 0  Anxiety Difficulty Not difficult at all - - -    BP Readings from Last 3 Encounters:  12/27/21 120/70  06/23/21 (!) 144/98  04/22/21 (!) 141/78    Physical Exam Vitals and nursing note reviewed.  Constitutional:      Appearance: She is well-developed.  HENT:     Head: Normocephalic.     Right Ear: Tympanic membrane and external ear normal.     Left Ear: Tympanic membrane and external ear normal.     Nose: Nose normal.  Eyes:     General: Lids are everted, no foreign bodies appreciated. No scleral icterus.       Left eye: No foreign body or hordeolum.     Conjunctiva/sclera: Conjunctivae normal.     Right eye: Right conjunctiva is not injected.     Left eye: Left conjunctiva is not injected.     Pupils: Pupils are equal, round, and reactive to light.  Neck:     Thyroid: No thyromegaly.     Vascular: No JVD.     Trachea: No tracheal deviation.  Cardiovascular:     Rate and Rhythm: Normal rate and regular rhythm.     Heart sounds: Normal heart sounds. No murmur heard.   No friction rub. No gallop.  Pulmonary:     Effort: Pulmonary effort is normal. No respiratory distress.     Breath sounds: Normal breath sounds. No wheezing, rhonchi or rales.  Abdominal:     General: Bowel sounds are normal.     Palpations: Abdomen is soft. There is no mass.     Tenderness: There is no abdominal tenderness. There is no guarding or rebound.  Musculoskeletal:        General: No tenderness. Normal range of motion.     Cervical back: Normal range of motion and neck supple.  Lymphadenopathy:     Cervical: No cervical adenopathy.  Skin:    General: Skin is warm.     Findings: No rash.  Neurological:     Mental Status: She is alert and oriented to person, place, and time.     Cranial Nerves: No cranial nerve deficit.     Deep Tendon Reflexes: Reflexes normal.  Psychiatric:         Mood and Affect: Mood is not anxious or depressed.    Wt Readings from Last 3 Encounters:  12/27/21 193 lb (87.5 kg)  06/23/21 195 lb (88.5 kg)  04/22/21 195 lb (88.5 kg)    BP 120/70    Pulse 60    Ht 5\' 4"  (1.626 m)    Wt 193 lb (87.5 kg)    BMI 33.13 kg/m  Assessment and Plan:  1. Benign essential HTN Chronic.  Controlled.  Stable.  Blood pressure 120/70.  Continue Zestril 10 mg once a day.  We will check renal function panel for current status of electrolytes and GFR.  We will recheck patient in 6 months. - lisinopril (ZESTRIL) 10 MG tablet; Take 1 tablet (10 mg total) by mouth daily.  Dispense: 90 tablet; Refill: 1 - Renal Function Panel  2. Moderate mixed hyperlipidemia not requiring statin therapy Chronic.  Controlled.  Stable.  Currently is controlling with diet and we will recheck lipid panel if we are maintaining control. - Lipid Panel With LDL/HDL Ratio

## 2021-12-27 NOTE — Patient Instructions (Signed)

## 2021-12-28 LAB — RENAL FUNCTION PANEL
Albumin: 4.4 g/dL (ref 3.7–4.7)
BUN/Creatinine Ratio: 23 (ref 12–28)
BUN: 17 mg/dL (ref 8–27)
CO2: 24 mmol/L (ref 20–29)
Calcium: 9.6 mg/dL (ref 8.7–10.3)
Chloride: 104 mmol/L (ref 96–106)
Creatinine, Ser: 0.74 mg/dL (ref 0.57–1.00)
Glucose: 85 mg/dL (ref 70–99)
Phosphorus: 4 mg/dL (ref 3.0–4.3)
Potassium: 5 mmol/L (ref 3.5–5.2)
Sodium: 143 mmol/L (ref 134–144)
eGFR: 85 mL/min/{1.73_m2} (ref >59)

## 2021-12-28 LAB — LIPID PANEL WITH LDL/HDL RATIO
Cholesterol, Total: 171 mg/dL (ref 100–199)
HDL: 45 mg/dL (ref >39)
LDL Chol Calc (NIH): 105 mg/dL — ABNORMAL HIGH (ref 0–99)
LDL/HDL Ratio: 2.3 ratio (ref 0.0–3.2)
Triglycerides: 116 mg/dL (ref 0–149)
VLDL Cholesterol Cal: 21 mg/dL (ref 5–40)

## 2022-02-12 ENCOUNTER — Ambulatory Visit: Payer: BC Managed Care – PPO

## 2022-02-28 ENCOUNTER — Telehealth: Payer: Self-pay | Admitting: Family Medicine

## 2022-02-28 NOTE — Telephone Encounter (Signed)
Copied from CRM [id number]. Topic: Medicare AWV ?>> Feb 28, 2022 10:31 AM Claudette Laws R wrote: ?Reason for CRM:  ?Left message for patient to call back and schedule Medicare Annual Wellness Visit (AWV) in office.  ? ?If unable to come into the office for AWV,  please offer to do virtually or by telephone. ? ?Last AWV:  02/06/2021 ? ?Please schedule at anytime with [facility] Health Advisor.     ? ?40 Minutes appointment  ? ?Any questions, please call me at [phone] ?

## 2022-03-27 ENCOUNTER — Telehealth: Payer: Self-pay | Admitting: Family Medicine

## 2022-03-27 NOTE — Telephone Encounter (Signed)
Copied from CRM [id number]. Topic: Medicare AWV ?>> Mar 27, 2022  1:20 PM Claudette Laws R wrote: ?Reason for CRM:  ?Left message for patient to call back and schedule Medicare Annual Wellness Visit (AWV) in office.  ? ?If unable to come into the office for AWV,  please offer to do virtually or by telephone. ? ?Last AWV:  02/06/2021 ? ?Please schedule at anytime with [facility] Health Advisor.     ? ?30 minute appointment for Virtual or phone ?45 minute appointment for in office or Initial virtual/phone ? ?Any questions, please call me at [phone] ?

## 2022-04-04 DIAGNOSIS — H401131 Primary open-angle glaucoma, bilateral, mild stage: Secondary | ICD-10-CM | POA: Diagnosis not present

## 2022-04-04 DIAGNOSIS — H43812 Vitreous degeneration, left eye: Secondary | ICD-10-CM | POA: Diagnosis not present

## 2022-04-04 DIAGNOSIS — H5213 Myopia, bilateral: Secondary | ICD-10-CM | POA: Diagnosis not present

## 2022-04-04 DIAGNOSIS — Z961 Presence of intraocular lens: Secondary | ICD-10-CM | POA: Diagnosis not present

## 2022-04-10 ENCOUNTER — Telehealth: Payer: Self-pay | Admitting: Family Medicine

## 2022-04-10 NOTE — Telephone Encounter (Signed)
Copied from CRM [id number]. Topic: Medicare AWV ?>> Apr 10, 2022 12:32 PM Claudette Laws R wrote: ?Reason for CRM:  ?Left message for patient to call back and schedule Medicare Annual Wellness Visit (AWV) in office.  ? ?If unable to come into the office for AWV,  please offer to do virtually or by telephone. ? ?Last AWV: 02/06/2021 ? ?Please schedule at anytime with [facility] Health Advisor.     ? ?30 minute appointment for Virtual or phone ?45 minute appointment for in office or Initial virtual/phone ? ?Any questions, please call me at [phone] ?

## 2022-05-03 ENCOUNTER — Telehealth: Payer: Self-pay | Admitting: Family Medicine

## 2022-05-03 NOTE — Telephone Encounter (Signed)
Copied from CRM [id number]. Topic: Medicare AWV ?>> May 03, 2022  1:53 PM Claudette Laws R wrote: ?Reason for CRM:  ?Left message for patient to call back and schedule Medicare Annual Wellness Visit (AWV) in office.  ? ?If unable to come into the office for AWV,  please offer to do virtually or by telephone. ? ?Last AWV: 02/06/2021 ? ?Please schedule at anytime with [facility] Health Advisor.     ? ?30 minute appointment for Virtual or phone ?45 minute appointment for in office or Initial virtual/phone ? ?Any questions, please call me at [phone] ?

## 2022-05-25 ENCOUNTER — Telehealth: Payer: Self-pay | Admitting: Family Medicine

## 2022-05-25 NOTE — Telephone Encounter (Signed)
Copied from CRM [id number]. Topic: Medicare AWV >> May 25, 2022 12:11 PM Claudette Laws R wrote: Reason for CRM:  Left message for patient to call back and schedule Medicare Annual Wellness Visit (AWV) in office.   If unable to come into the office for AWV,  please offer to do virtually or by telephone.  Last AWV:  02/06/2021  Please schedule at anytime with [facility] Health Advisor.      30 minute appointment for Virtual or phone 45 minute appointment for in office or Initial virtual/phone  Any questions, please call me at [phone]

## 2022-06-05 ENCOUNTER — Telehealth: Payer: Self-pay | Admitting: Family Medicine

## 2022-06-05 NOTE — Telephone Encounter (Signed)
Copied from CRM [id number]. Topic: Medicare AWV >> Jun 05, 2022  2:24 PM Zannie Kehr wrote: Reason for CRM:  Left message for patient to call back and schedule Medicare Annual Wellness Visit (AWV) in office.   If unable to come into the office for AWV,  please offer to do virtually or by telephone.  Last AWV: 02/06/2021  Please schedule at anytime with [facility] Health Advisor.      30 minute appointment for Virtual or phone 45 minute appointment for in office or Initial virtual/phone  Any questions, please call me at [phone]

## 2022-06-27 ENCOUNTER — Ambulatory Visit: Payer: BC Managed Care – PPO | Admitting: Family Medicine

## 2022-06-28 ENCOUNTER — Ambulatory Visit: Payer: BC Managed Care – PPO | Admitting: Family Medicine

## 2022-07-05 ENCOUNTER — Ambulatory Visit: Payer: BC Managed Care – PPO | Admitting: Family Medicine

## 2022-07-05 ENCOUNTER — Encounter: Payer: Self-pay | Admitting: Family Medicine

## 2022-07-05 VITALS — BP 120/80 | HR 78 | Ht 64.0 in | Wt 192.0 lb

## 2022-07-05 DIAGNOSIS — I10 Essential (primary) hypertension: Secondary | ICD-10-CM | POA: Diagnosis not present

## 2022-07-05 DIAGNOSIS — M170 Bilateral primary osteoarthritis of knee: Secondary | ICD-10-CM

## 2022-07-05 MED ORDER — LISINOPRIL 10 MG PO TABS: 10.0000 mg | ORAL_TABLET | Freq: Every day | ORAL | 1 refills | Status: DC

## 2022-07-05 MED ORDER — IBUPROFEN 600 MG PO TABS: 600.0000 mg | ORAL_TABLET | Freq: Four times a day (QID) | ORAL | 5 refills | Status: DC | PRN

## 2022-07-05 NOTE — Progress Notes (Signed)
Date:  07/05/2022   Name:  Catherine Pena   DOB:  13-Mar-1946   MRN:  [id number]   Chief Complaint: Hypertension and Knee Pain  Hypertension This is a chronic problem. The current episode started more than 1 year ago. The problem has been gradually improving since onset. The problem is controlled. Associated symptoms include headaches. Pertinent negatives include no chest pain, neck pain, orthopnea, palpitations, peripheral edema, PND or shortness of breath. There are no associated agents to hypertension. Past treatments include ACE inhibitors. The current treatment provides moderate improvement. There are no compliance problems.  There is no history of angina, kidney disease, CAD/MI, CVA, heart failure, left ventricular hypertrophy, PVD or retinopathy. There is no history of chronic renal disease, a hypertension causing med or renovascular disease.  Knee Pain  Incident onset: chronic. There was no injury mechanism. The pain is present in the right knee and left knee. The pain is moderate. The pain has been Intermittent since onset. Pertinent negatives include no loss of motion, loss of sensation or numbness. The symptoms are aggravated by movement and weight bearing. The treatment provided moderate relief.    Lab Results  Component Value Date   NA 143 12/27/2021   K 5.0 12/27/2021   CO2 24 12/27/2021   GLUCOSE 85 12/27/2021   BUN 17 12/27/2021   CREATININE 0.74 12/27/2021   CALCIUM 9.6 12/27/2021   EGFR 85 12/27/2021   GFRNONAA 83 01/06/2021   Lab Results  Component Value Date   CHOL 171 12/27/2021   HDL 45 12/27/2021   LDLCALC 105 (H) 12/27/2021   TRIG 116 12/27/2021   CHOLHDL 3.2 10/24/2017   No results found for: "TSH" No results found for: "HGBA1C" Lab Results  Component Value Date   WBC 5.1 08/06/2016   HGB 13.2 08/06/2016   HCT 37.3 08/06/2016   MCV 82.7 08/06/2016   PLT 137 (L) 08/06/2016   No results found for: "ALT", "AST", "GGT", "ALKPHOS", "BILITOT" No  results found for: "25OHVITD2", "25OHVITD3", "VD25OH"   Review of Systems  Constitutional:  Negative for chills and fever.  HENT:  Negative for drooling, ear discharge, ear pain and sore throat.   Respiratory:  Negative for cough, shortness of breath and wheezing.   Cardiovascular:  Negative for chest pain, palpitations, orthopnea, leg swelling and PND.  Gastrointestinal:  Negative for abdominal pain, blood in stool, constipation, diarrhea and nausea.  Endocrine: Negative for polydipsia.  Genitourinary:  Negative for dysuria, frequency, hematuria and urgency.  Musculoskeletal:  Negative for back pain, myalgias and neck pain.  Skin:  Negative for rash.  Allergic/Immunologic: Negative for environmental allergies.  Neurological:  Positive for headaches. Negative for dizziness and numbness.  Hematological:  Does not bruise/bleed easily.  Psychiatric/Behavioral:  Negative for suicidal ideas. The patient is not nervous/anxious.     Patient Active Problem List   Diagnosis Date Noted   Benign essential HTN 04/14/2015   Adiposity 04/14/2015   Borderline diabetes 04/14/2015    No Known Allergies  Past Surgical History:  Procedure Laterality Date   CATARACT EXTRACTION, BILATERAL Bilateral 10/2016 and 11/2016   EYE SURGERY      Social History   Tobacco Use   Smoking status: Former    Packs/day: 0.25    Years: 20.00    Total pack years: 5.00    Types: Cigarettes   Smokeless tobacco: Never  Vaping Use   Vaping Use: Never used  Substance Use Topics   Alcohol use: No   Drug  use: No     Medication list has been reviewed and updated.  Current Meds  Medication Sig   albuterol (VENTOLIN HFA) 108 (90 Base) MCG/ACT inhaler Inhale 1-2 puffs into the lungs every 6 (six) hours as needed for wheezing or shortness of breath.   ibuprofen (ADVIL) 600 MG tablet Take 1 tablet (600 mg total) by mouth every 6 (six) hours as needed.   latanoprost (XALATAN) 0.005 % ophthalmic solution Place 1  drop into both eyes at bedtime.   lisinopril (ZESTRIL) 10 MG tablet Take 1 tablet (10 mg total) by mouth daily.   timolol (BETIMOL) 0.5 % ophthalmic solution Place 1 drop into the right eye daily.       07/05/2022    9:28 AM 12/27/2021   10:28 AM 06/23/2021   10:31 AM 01/06/2021   11:27 AM  GAD 7 : Generalized Anxiety Score  Nervous, Anxious, on Edge 0 0 0 0  Control/stop worrying 0 0 0 0  Worry too much - different things 0 0 0 0  Trouble relaxing 0 0 0 0  Restless 0 0 0 0  Easily annoyed or irritable 0 0 0 0  Afraid - awful might happen 0 0 0 0  Total GAD 7 Score 0 0 0 0  Anxiety Difficulty Not difficult at all Not difficult at all         07/05/2022    9:28 AM 12/27/2021   10:28 AM 06/23/2021   10:31 AM  Depression screen PHQ 2/9  Decreased Interest 0 0 0  Down, Depressed, Hopeless 0 0 0  PHQ - 2 Score 0 0 0  Altered sleeping 0 0 0  Tired, decreased energy 0 0 0  Change in appetite 0 0 0  Feeling bad or failure about yourself  0 0 0  Trouble concentrating 0 0 0  Moving slowly or fidgety/restless 0 0 0  Suicidal thoughts 0 0 0  PHQ-9 Score 0 0 0  Difficult doing work/chores Not difficult at all Not difficult at all     BP Readings from Last 3 Encounters:  07/05/22 120/80  12/27/21 120/70  06/23/21 (!) 144/98    Physical Exam Vitals and nursing note reviewed. Exam conducted with a chaperone present.  Constitutional:      General: She is not in acute distress.    Appearance: She is not diaphoretic.  HENT:     Head: Normocephalic and atraumatic.     Right Ear: External ear normal.     Left Ear: External ear normal.     Nose: Nose normal.     Mouth/Throat:     Mouth: Mucous membranes are moist.  Eyes:     General:        Right eye: No discharge.        Left eye: No discharge.     Conjunctiva/sclera: Conjunctivae normal.     Pupils: Pupils are equal, round, and reactive to light.  Neck:     Thyroid: No thyromegaly.     Vascular: No JVD.  Cardiovascular:      Rate and Rhythm: Normal rate and regular rhythm.     Heart sounds: Normal heart sounds. No murmur heard.    No friction rub. No gallop.  Pulmonary:     Effort: Pulmonary effort is normal.     Breath sounds: Normal breath sounds.  Abdominal:     General: Bowel sounds are normal.     Palpations: Abdomen is soft. There is no mass.  Tenderness: There is no abdominal tenderness. There is no guarding or rebound.  Musculoskeletal:        General: Normal range of motion.     Cervical back: Normal range of motion and neck supple.     Right knee: Tenderness present over the medial joint line, MCL and LCL.     Left knee: Tenderness present over the medial joint line, MCL and LCL.  Lymphadenopathy:     Cervical: No cervical adenopathy.  Skin:    General: Skin is warm and dry.  Neurological:     Mental Status: She is alert.     Motor: No weakness.     Coordination: Coordination normal.     Deep Tendon Reflexes: Reflexes are normal and symmetric.     Reflex Scores:      Patellar reflexes are 2+ on the right side and 2+ on the left side.    Wt Readings from Last 3 Encounters:  07/05/22 192 lb (87.1 kg)  12/27/21 193 lb (87.5 kg)  06/23/21 195 lb (88.5 kg)    BP 120/80   Pulse 78   Ht 5' 4" (1.626 m)   Wt 192 lb (87.1 kg)   BMI 32.96 kg/m   Assessment and Plan:  1. Benign essential HTN Chronic.  Controlled.  Stable.  Blood pressure 120/80.  Patient is asymptomatic.  Patient is tolerating medications well.  Continue lisinopril 10 mg once a day.  We will recheck patient in 6 months.  Labs were reviewed and acceptable from January and we will repeat these in 6 months as well. - lisinopril (ZESTRIL) 10 MG tablet; Take 1 tablet (10 mg total) by mouth daily.  Dispense: 90 tablet; Refill: 1  2. Primary osteoarthritis of both knees Chronic.  Persistent.  Stable.  Patient would like to have another dosing of ibuprofen at 600 mg every 6 hours and GFR is in normal range.  We have also  discussed the possibility of involving sports medicine to look to see if there is some other modalities that may help with her arthritis. - ibuprofen (ADVIL) 600 MG tablet; Take 1 tablet (600 mg total) by mouth every 6 (six) hours as needed.  Dispense: 30 tablet; Refill: 5

## 2022-08-01 DIAGNOSIS — H43812 Vitreous degeneration, left eye: Secondary | ICD-10-CM | POA: Diagnosis not present

## 2022-08-01 DIAGNOSIS — Z961 Presence of intraocular lens: Secondary | ICD-10-CM | POA: Diagnosis not present

## 2022-08-01 DIAGNOSIS — H5213 Myopia, bilateral: Secondary | ICD-10-CM | POA: Diagnosis not present

## 2022-08-01 DIAGNOSIS — H401131 Primary open-angle glaucoma, bilateral, mild stage: Secondary | ICD-10-CM | POA: Diagnosis not present

## 2022-08-10 ENCOUNTER — Telehealth: Payer: Self-pay | Admitting: Family Medicine

## 2022-08-10 NOTE — Telephone Encounter (Signed)
Copied from CRM [id number]. Topic: Medicare AWV >> Aug 10, 2022  3:17 PM Zannie Kehr wrote: Reason for CRM:  Left message for patient to call back and schedule Medicare Annual Wellness Visit (AWV) in office.   If unable to come into the office for AWV,  please offer to do virtually or by telephone.  Last AWV: 02/06/2021  Please schedule at anytime with [facility] Health Advisor.      30 minute appointment for Virtual or phone 45 minute appointment for in office or Initial virtual/phone  Any questions, please call me at [phone]

## 2022-09-06 IMAGING — CR DG KNEE 1-2V*R*
2 series · 2 of 2 positions shown · non-contrast
Comparison: None.

CLINICAL DATA: Right knee pain

EXAM:
RIGHT KNEE - 1-2 VIEW

[knee ap]
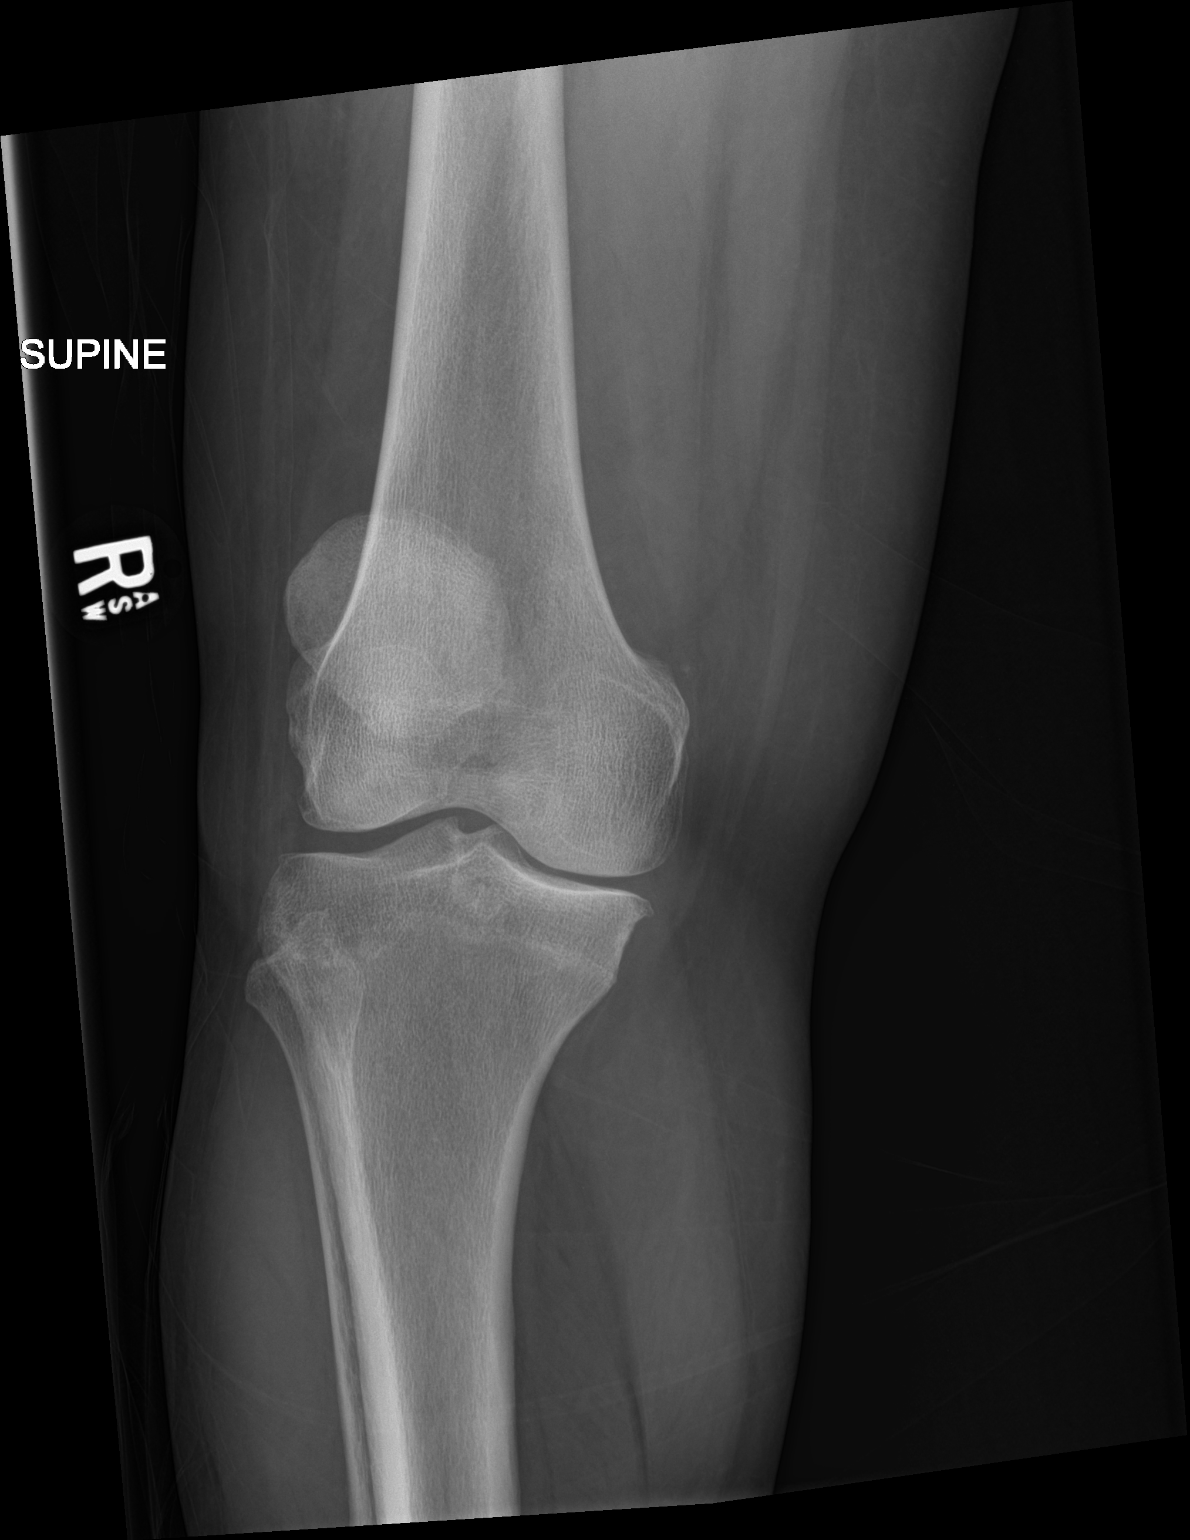

[knee lat]
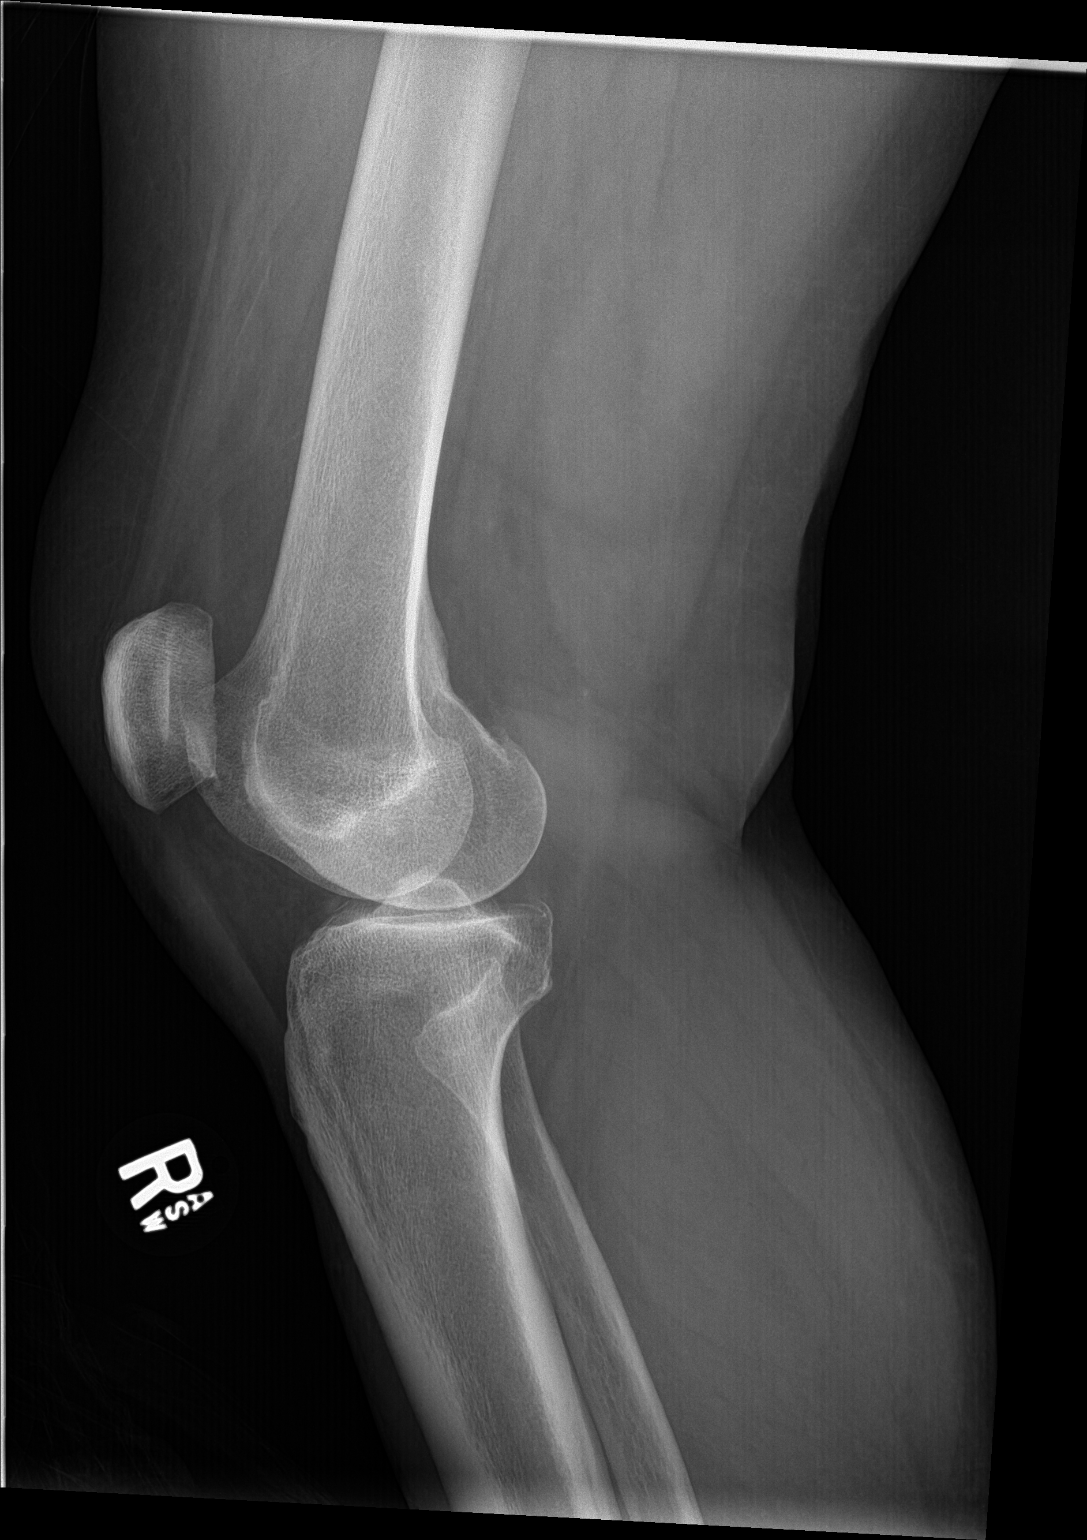

[2 of 2 positions shown; findings below may reference images not displayed]

FINDINGS: Joint space narrowing in the medial compartment. No acute bony
abnormality. Specifically, no fracture, subluxation, or dislocation.
IMPRESSION: No acute bony abnormality.

## 2022-10-08 ENCOUNTER — Telehealth: Payer: Self-pay | Admitting: Family Medicine

## 2022-10-08 NOTE — Telephone Encounter (Signed)
Copied from [place] [id number]. Topic: Medicare AWV >> Oct 08, 2022 11:38 AM Jae Dire wrote: Reason for CRM:  Left message for patient to call back and schedule Medicare Annual Wellness Visit (AWV) in office.   If unable to come into the office for AWV,  please offer to do virtually or by telephone.  Last AWV: 02/06/2021  Please schedule at any time with [facility].      30 minute appointment for Virtual or phone 45 minute appointment for in office or Initial virtual/phone  Any questions, please call me at [phone]

## 2022-11-24 ENCOUNTER — Encounter: Payer: Self-pay | Admitting: Emergency Medicine

## 2022-11-24 ENCOUNTER — Ambulatory Visit
Admission: EM | Admit: 2022-11-24 | Discharge: 2022-11-24 | Disposition: A | Discharge status: Home / Self Care | Payer: BC Managed Care – PPO | Attending: Internal Medicine | Admitting: Internal Medicine

## 2022-11-24 DIAGNOSIS — J329 Chronic sinusitis, unspecified: Secondary | ICD-10-CM

## 2022-11-24 DIAGNOSIS — B9789 Other viral agents as the cause of diseases classified elsewhere: Secondary | ICD-10-CM

## 2022-11-24 MED ORDER — PREDNISONE 10 MG PO TABS: ORAL_TABLET | ORAL | 0 refills | Status: DC

## 2022-11-24 MED ORDER — PROMETHAZINE-DM 6.25-15 MG/5ML PO SYRP: 5.0000 mL | ORAL_SOLUTION | Freq: Four times a day (QID) | ORAL | 0 refills | Status: DC | PRN

## 2022-11-24 NOTE — ED Provider Notes (Signed)
[facility]    CSN: [id number] Arrival date & time: 11/24/22  1228      History   Chief Complaint Chief Complaint  Patient presents with   Cough   Nasal Congestion    HPI Catherine Pena is a 76 y.o. female with a history of HTN and DM 2 presents to UC today with complaint of nasal congestion, sore throat and cough.  She reports this started 1 week ago.  She is blowing pale yellow mucus out of her nose.  She denies difficulty swallowing.  The cough is productive of pale yellow mucus.  She denies headache, runny nose, ear pain, shortness of breath, chest pain, nausea, vomiting or diarrhea.  She denies fever, chills or body aches.  She has taken allergy medicine OTC with minimal relief of symptoms.  She has not had sick contacts that she is aware of.  HPI  Past Medical History:  Diagnosis Date   Glaucoma    Hypertension     Patient Active Problem List   Diagnosis Date Noted   Benign essential HTN 04/14/2015   Adiposity 04/14/2015   Borderline diabetes 04/14/2015    Past Surgical History:  Procedure Laterality Date   CATARACT EXTRACTION, BILATERAL Bilateral 10/2016 and 11/2016   EYE SURGERY      OB History   No obstetric history on file.      Home Medications    Prior to Admission medications   Medication Sig Start Date End Date Taking? Authorizing Provider  predniSONE (DELTASONE) 10 MG tablet Take 6 tabs on day 1, 5 tabs on day 2, 4 tabs on day 3, 3 tabs on day 4, 2 tabs on day 5, 1 tab on day 6 11/24/22  Yes Lara Palinkas, Coralie Keens, NP  promethazine-dextromethorphan (PROMETHAZINE-DM) 6.25-15 MG/5ML syrup Take 5 mLs by mouth 4 (four) times daily as needed for cough. 11/24/22  Yes Benjimin Hadden, Coralie Keens, NP  albuterol (VENTOLIN HFA) 108 (90 Base) MCG/ACT inhaler Inhale 1-2 puffs into the lungs every 6 (six) hours as needed for wheezing or shortness of breath. 04/22/21   Verda Cumins, MD  ibuprofen (ADVIL) 600 MG tablet Take 1 tablet (600 mg total) by mouth every 6 (six)  hours as needed. 07/05/22   Juline Patch, MD  latanoprost (XALATAN) 0.005 % ophthalmic solution Place 1 drop into both eyes at bedtime. 09/30/17   [provider]  lisinopril (ZESTRIL) 10 MG tablet Take 1 tablet (10 mg total) by mouth daily. 07/05/22   Juline Patch, MD  timolol (BETIMOL) 0.5 % ophthalmic solution Place 1 drop into the right eye daily.    [provider]    Family History Family History  Problem Relation Age of Onset   Ovarian cancer Mother    Diabetes Father     Social History Social History   Tobacco Use   Smoking status: Former    Packs/day: 0.25    Years: 20.00    Total pack years: 5.00    Types: Cigarettes   Smokeless tobacco: Never  Vaping Use   Vaping Use: Never used  Substance Use Topics   Alcohol use: No   Drug use: No     Allergies   Patient has no known allergies.   Review of Systems Review of Systems  Constitutional:  Negative for chills and fever.  HENT:  Positive for congestion and sore throat. Negative for ear pain, rhinorrhea, sinus pressure and sinus pain.   Eyes:  Negative for pain and redness.  Respiratory:  Positive for cough. Negative for chest tightness and shortness of breath.   Cardiovascular:  Negative for chest pain and palpitations.  Gastrointestinal:  Negative for diarrhea, nausea and vomiting.  Musculoskeletal:  Negative for myalgias.  Skin:  Negative for rash.  Allergic/Immunologic: Positive for environmental allergies.  Neurological:  Negative for dizziness, weakness and headaches.     Physical Exam Triage Vital Signs ED Triage Vitals  Enc Vitals Group     BP 11/24/22 1356 (!) 161/79     Pulse Rate 11/24/22 1356 63     Resp 11/24/22 1356 18     Temp 11/24/22 1356 98.1 F (36.7 C)     Temp Source 11/24/22 1356 Oral     SpO2 11/24/22 1356 97 %     Weight --      Height --      Head Circumference --      Peak Flow --      Pain Score 11/24/22 1355 0     Pain Loc --      Pain Edu? --       Excl. in GC? --    No data found.  Updated Vital Signs BP (!) 161/79 (BP Location: Left Arm)   Pulse 63   Temp 98.1 F (36.7 C) (Oral)   Resp 18   SpO2 97%      Physical Exam Constitutional:      Appearance: Normal appearance.  HENT:     Head: Normocephalic.     Comments: No sinus tenderness noted    Right Ear: Tympanic membrane, ear canal and external ear normal.     Left Ear: Tympanic membrane, ear canal and external ear normal.     Nose: Congestion present. No rhinorrhea.     Mouth/Throat:     Mouth: Mucous membranes are moist.     Pharynx: Oropharynx is clear. No oropharyngeal exudate or posterior oropharyngeal erythema.  Eyes:     Conjunctiva/sclera: Conjunctivae normal.     Pupils: Pupils are equal, round, and reactive to light.  Cardiovascular:     Rate and Rhythm: Normal rate and regular rhythm.  Pulmonary:     Effort: Pulmonary effort is normal.     Breath sounds: Normal breath sounds.  Lymphadenopathy:     Cervical: No cervical adenopathy.  Skin:    General: Skin is warm and dry.     Findings: No rash.  Neurological:     General: No focal deficit present.     Mental Status: She is alert and oriented to person, place, and time.      UC Treatments / Results    Medications Ordered in UC Medications - No data to display  Initial Impression / Assessment and Plan / UC Course  I have reviewed the triage vital signs and the nursing notes.  Pertinent labs & imaging results that were available during my care of the patient were reviewed by me and considered in my medical decision making (see chart for details).    76 year old female with complaint of nasal congestion, sore throat and cough x1 week.  DDx include viral URI with cough, viral sinusitis, bacterial sinusitis, viral pharyngitis, bacterial pharyngitis, acute bronchitis, pneumonia.  Discussed why we would not test for COVID/flu at this time as her symptoms have been going on so long that she would not  qualify for antiviral therapy.  Exam most consistent with viral sinusitis.  Will treat with Prednisone taper x6 days.  We will also provide Rx for  Promethazine DM cough syrup-sedation caution given.  Encouraged her to continue her allergy medication.  Advised her to follow-up with her PCP if symptoms persist or worsen. Final Clinical Impressions(s) / UC Diagnoses   Final diagnoses:  Viral sinusitis     Discharge Instructions      You were seen today for sinus symptoms.  You have been diagnosed with a viral sinus infection.  We do not treat these with antibiotics.  I am given you prednisone for symptom management.  I am also providing you with a cough syrup to help your cough.  The cough syrup may cause sedation.  It is important that you take your allergy medication daily.  Please follow-up with your PCP if symptoms persist or worsen.     ED Prescriptions     Medication Sig Dispense Auth. Provider   predniSONE (DELTASONE) 10 MG tablet Take 6 tabs on day 1, 5 tabs on day 2, 4 tabs on day 3, 3 tabs on day 4, 2 tabs on day 5, 1 tab on day 6 21 tablet Jearld Fenton, NP   promethazine-dextromethorphan (PROMETHAZINE-DM) 6.25-15 MG/5ML syrup Take 5 mLs by mouth 4 (four) times daily as needed for cough. 118 mL Jearld Fenton, NP      PDMP not reviewed this encounter.   Jearld Fenton, NP 11/24/22 1415

## 2022-11-24 NOTE — Discharge Instructions (Addendum)
You were seen today for sinus symptoms.  You have been diagnosed with a viral sinus infection.  We do not treat these with antibiotics.  I am given you prednisone for symptom management.  I am also providing you with a cough syrup to help your cough.  The cough syrup may cause sedation.  It is important that you take your allergy medication daily.  Please follow-up with your PCP if symptoms persist or worsen.

## 2022-11-24 NOTE — ED Triage Notes (Signed)
Pt had cough with yellow phlegm and congestion since last weekend. Reports OTC allergy medications.

## 2022-12-03 DIAGNOSIS — H5213 Myopia, bilateral: Secondary | ICD-10-CM | POA: Diagnosis not present

## 2022-12-03 DIAGNOSIS — H43812 Vitreous degeneration, left eye: Secondary | ICD-10-CM | POA: Diagnosis not present

## 2022-12-03 DIAGNOSIS — H401131 Primary open-angle glaucoma, bilateral, mild stage: Secondary | ICD-10-CM | POA: Diagnosis not present

## 2022-12-03 DIAGNOSIS — Z961 Presence of intraocular lens: Secondary | ICD-10-CM | POA: Diagnosis not present

## 2023-01-01 ENCOUNTER — Encounter: Payer: Self-pay | Admitting: Emergency Medicine

## 2023-01-01 ENCOUNTER — Ambulatory Visit
Admission: EM | Admit: 2023-01-01 | Discharge: 2023-01-01 | Disposition: A | Discharge status: Home / Self Care | Payer: BC Managed Care – PPO | Attending: Emergency Medicine | Admitting: Emergency Medicine

## 2023-01-01 DIAGNOSIS — U071 COVID-19: Secondary | ICD-10-CM | POA: Diagnosis not present

## 2023-01-01 DIAGNOSIS — I10 Essential (primary) hypertension: Secondary | ICD-10-CM | POA: Diagnosis not present

## 2023-01-01 DIAGNOSIS — Z20822 Contact with and (suspected) exposure to covid-19: Secondary | ICD-10-CM | POA: Diagnosis not present

## 2023-01-01 LAB — RESP PANEL BY RT-PCR (RSV, FLU A&B, COVID)  RVPGX2
Influenza A by PCR: NEGATIVE
Influenza B by PCR: NEGATIVE
Resp Syncytial Virus by PCR: NEGATIVE
SARS Coronavirus 2 by RT PCR: POSITIVE — AB

## 2023-01-01 MED ORDER — ALBUTEROL SULFATE HFA 108 (90 BASE) MCG/ACT IN AERS: 2.0000 | INHALATION_SPRAY | RESPIRATORY_TRACT | 0 refills | Status: AC | PRN

## 2023-01-01 MED ORDER — IPRATROPIUM BROMIDE 0.06 % NA SOLN: 2.0000 | Freq: Four times a day (QID) | NASAL | 0 refills | Status: DC

## 2023-01-01 MED ORDER — AEROCHAMBER MV MISC: 1 refills | Status: DC

## 2023-01-01 MED ORDER — NIRMATRELVIR/RITONAVIR (PAXLOVID)TABLET: 3.0000 | ORAL_TABLET | Freq: Two times a day (BID) | ORAL | 0 refills | Status: AC

## 2023-01-01 NOTE — ED Provider Notes (Signed)
HPI  SUBJECTIVE:  Catherine Pena is a 77 y.o. female who presents with 3 days of nasal congestion, light white rhinorrhea, postnasal drip, cough productive of the same material as her nasal congestion, mild sinus pain and pressure and wheezing.  No fevers, body aches, headaches, facial swelling, dental pain, shortness of breath, dyspnea on exertion, loss of sense of smell or taste, nausea, vomiting, diarrhea, abdominal pain.  No known COVID, flu, RSV exposure.  She had 4 doses of the COVID-vaccine and this years flu vaccine.  No allergy symptoms.  No antibiotics in the past month.  No antipyretic in past 6 hours.  She tried albuterol with improvement in her wheezing.  She also is taking Promethazine DM with improvement in cough.  No aggravating factors.  She is able to sleep at night comfortably with the Promethazine DM.  She has a past medical history of hypertension, borderline diabetes, reactive airway disease when sick.  No history of pulmonary disease, chronic kidney disease.  PCP: [facility].    Past Medical History:  Diagnosis Date   Glaucoma    Hypertension     Past Surgical History:  Procedure Laterality Date   CATARACT EXTRACTION, BILATERAL Bilateral 10/2016 and 11/2016   EYE SURGERY      Family History  Problem Relation Age of Onset   Ovarian cancer Mother    Diabetes Father     Social History   Tobacco Use   Smoking status: Former    Packs/day: 0.25    Years: 20.00    Total pack years: 5.00    Types: Cigarettes   Smokeless tobacco: Never  Vaping Use   Vaping Use: Never used  Substance Use Topics   Alcohol use: No   Drug use: No    No current facility-administered medications for this encounter.  Current Outpatient Medications:    ipratropium (ATROVENT) 0.06 % nasal spray, Place 2 sprays into both nostrils 4 (four) times daily., Disp: 15 mL, Rfl: 0   Spacer/Aero-Holding Chambers (AEROCHAMBER MV) inhaler, Use as instructed, Disp: 1 each, Rfl: 1    albuterol (VENTOLIN HFA) 108 (90 Base) MCG/ACT inhaler, Inhale 2 puffs into the lungs every 4 (four) hours as needed for wheezing or shortness of breath., Disp: 1 each, Rfl: 0   ibuprofen (ADVIL) 600 MG tablet, Take 1 tablet (600 mg total) by mouth every 6 (six) hours as needed., Disp: 30 tablet, Rfl: 5   latanoprost (XALATAN) 0.005 % ophthalmic solution, Place 1 drop into both eyes at bedtime., Disp: , Rfl: 3   lisinopril (ZESTRIL) 10 MG tablet, Take 1 tablet (10 mg total) by mouth daily., Disp: 90 tablet, Rfl: 1   promethazine-dextromethorphan (PROMETHAZINE-DM) 6.25-15 MG/5ML syrup, Take 5 mLs by mouth 4 (four) times daily as needed for cough., Disp: 118 mL, Rfl: 0   timolol (BETIMOL) 0.5 % ophthalmic solution, Place 1 drop into the right eye daily., Disp: , Rfl:   No Known Allergies   ROS  As noted in HPI.   Physical Exam  BP (!) 179/93 (BP Location: Left Arm)   Pulse (!) 57   Temp 98.2 F (36.8 C) (Oral)   Resp 16   SpO2 98%   Constitutional: Well developed, well nourished, no acute distress Eyes:  EOMI, conjunctiva normal bilaterally HENT: Normocephalic, atraumatic,mucus membranes moist.  Normal turbinates.  Clear nasal congestion.  No maxillary, frontal sinus tenderness.  Normal oropharynx.  Normal tonsils without exudates.  Uvula midline.  Positive postnasal drip. Neck: Positive cervical lymphadenopathy  Respiratory: Normal inspiratory effort, lungs clear bilaterally Cardiovascular: Regular bradycardia, no murmurs rubs or gallops GI: nondistended skin: No rash, skin intact Musculoskeletal: no deformities Neurologic: Alert & oriented x 3, no focal neuro deficits Psychiatric: Speech and behavior appropriate   ED Course   Medications - No data to display  Orders Placed This Encounter  Procedures   Resp panel by RT-PCR (RSV, Flu A&B, Covid) Anterior Nasal Swab    Standing Status:   Standing    Number of Occurrences:   1   Airborne and Contact precautions    Standing  Status:   Standing    Number of Occurrences:   1    Results for orders placed or performed during the hospital encounter of 01/01/23 (from the past 24 hour(s))  Resp panel by RT-PCR (RSV, Flu A&B, Covid) Anterior Nasal Swab     Status: Abnormal   Collection Time: 01/01/23  5:44 PM   Specimen: Anterior Nasal Swab  Result Value Ref Range   SARS Coronavirus 2 by RT PCR POSITIVE (A) NEGATIVE   Influenza A by PCR NEGATIVE NEGATIVE   Influenza B by PCR NEGATIVE NEGATIVE   Resp Syncytial Virus by PCR NEGATIVE NEGATIVE   No results found.  ED Clinical Impression  1. COVID-19 virus infection   2. Encounter for laboratory testing for COVID-19 virus   3. Elevated blood pressure reading in office with diagnosis of hypertension      ED Assessment/Plan      1.  Upper respiratory tract infection.  Checking COVID, flu, RSV.  Unfortunately patient is out of the treatment window for influenza, but will prescribe Paxlovid if COVID is positive.  In the meantime, Atrovent nasal spray, saline nasal irrigation, Mucinex, will refill her albuterol inhaler and also prescribe a spacer for her to use every 4 hours as needed.  Follow-up with PCP as needed.  GFR from labs done 12/27/21 85  COVID-positive.  Discussed this with patient over the phone.  Prescribing Paxlovid.  She will go pick it up tomorrow.  She is to continue the plan as outlined above.  ER return precautions given.  2.  Elevated blood pressure.  Patient asymptomatic.  Will have patient keep an eye on this.  ER hypertensive emergency return precautions given.   Discussed labs, MDM, treatment plan, and plan for follow-up with patient. patient agrees with plan.   Meds ordered this encounter  Medications   albuterol (VENTOLIN HFA) 108 (90 Base) MCG/ACT inhaler    Sig: Inhale 2 puffs into the lungs every 4 (four) hours as needed for wheezing or shortness of breath.    Dispense:  1 each    Refill:  0   ipratropium (ATROVENT) 0.06 % nasal  spray    Sig: Place 2 sprays into both nostrils 4 (four) times daily.    Dispense:  15 mL    Refill:  0   Spacer/Aero-Holding Chambers (AEROCHAMBER MV) inhaler    Sig: Use as instructed    Dispense:  1 each    Refill:  1      *This clinic note was created using Dragon dictation software. Therefore, there may be occasional mistakes despite careful proofreading.  ?Domenick Gong, MD 01/01/23 2001

## 2023-01-01 NOTE — Discharge Instructions (Addendum)
We will contact you if your COVID, flu, or RSV come back positive and discuss next steps.  I will prescribe Paxlovid if your COVID is positive.  You can also call here and get your results.  Atrovent nasal spray, Mucinex, saline nasal irrigation with a Milta Deiters Med rinse and distilled water as often as you want help prevent a bacterial sinus infection.  2 puffs from your albuterol inhaler using your spacer every 4-6 hours as needed for coughing, wheezing.   Keep an eye on your blood pressure.  It was elevated today. Measure your blood pressure once a day, preferably at the same time every day. Keep a log of this and bring it to your next doctor's appointment.  Bring your blood pressure cuff as well.  Return here in 2 weeks for blood pressure recheck if you're unable to see your primary care provider and your blood pressure remains persistently above 140/90. Return immediately to the ER if you start having chest pain, headache, problems seeing, problems talking, problems walking, if you feel like you're about to pass out, if you do pass out, if you have a seizure, or for any other concerns.  Go to www.goodrx.com  or www.costplusdrugs.com to look up your medications. This will give you a list of where you can find your prescriptions at the most affordable prices. Or ask the pharmacist what the cash price is, or if they have any other discount programs available to help make your medication more affordable. This can be less expensive than what you would pay with insurance.

## 2023-01-01 NOTE — ED Triage Notes (Signed)
Pt presents with a cough and runny nose x 3 days  

## 2023-01-02 ENCOUNTER — Other Ambulatory Visit: Payer: Self-pay | Admitting: Family Medicine

## 2023-01-02 DIAGNOSIS — I10 Essential (primary) hypertension: Secondary | ICD-10-CM

## 2023-01-02 NOTE — Telephone Encounter (Signed)
Requested Prescriptions  Pending Prescriptions Disp Refills   lisinopril (ZESTRIL) 10 MG tablet [Pharmacy Med Name: LISINOPRIL 10 MG TABLET] 90 tablet 0    Sig: TAKE 1 TABLET BY MOUTH EVERY DAY     Cardiovascular:  ACE Inhibitors Failed - 01/02/2023  1:32 AM      Failed - Cr in normal range and within 180 days    Creatinine  Date Value Ref Range Status  10/29/2014 0.77 0.60 - 1.30 mg/dL Final   Creatinine, Ser  Date Value Ref Range Status  12/27/2021 0.74 0.57 - 1.00 mg/dL Final         Failed - K in normal range and within 180 days    Potassium  Date Value Ref Range Status  12/27/2021 5.0 3.5 - 5.2 mmol/L Final  10/29/2014 3.9 3.5 - 5.1 mmol/L Final         Failed - Last BP in normal range    BP Readings from Last 1 Encounters:  01/01/23 (!) 179/93         Failed - Valid encounter within last 6 months    Recent Outpatient Visits           6 months ago Benign essential HTN   [facility] [place], Deanna C, MD   1 year ago Benign essential HTN   [facility] [place], Deanna C, MD   1 year ago Benign essential HTN   [facility] [place], Deanna C, MD   1 year ago Benign essential HTN   [facility] [place], Deanna C, MD   2 years ago Benign essential HTN    [facility] [place], [place], MD       Future Appointments             In 5 days Juline Patch, MD [facility] at [facility], [place] - Patient is not pregnant

## 2023-01-07 ENCOUNTER — Ambulatory Visit: Payer: BC Managed Care – PPO | Admitting: Family Medicine

## 2023-01-14 ENCOUNTER — Ambulatory Visit: Payer: BC Managed Care – PPO | Admitting: Family Medicine

## 2023-01-14 ENCOUNTER — Encounter: Payer: Self-pay | Admitting: Family Medicine

## 2023-01-14 VITALS — BP 126/76 | HR 72 | Ht 64.0 in | Wt 192.0 lb

## 2023-01-14 DIAGNOSIS — E782 Mixed hyperlipidemia: Secondary | ICD-10-CM | POA: Diagnosis not present

## 2023-01-14 DIAGNOSIS — I10 Essential (primary) hypertension: Secondary | ICD-10-CM | POA: Diagnosis not present

## 2023-01-14 MED ORDER — LISINOPRIL 10 MG PO TABS: 10.0000 mg | ORAL_TABLET | Freq: Every day | ORAL | 1 refills | Status: DC

## 2023-01-14 NOTE — Patient Instructions (Signed)
GUIDELINES FOR  LOW-CHOLESTEROL, LOW-TRIGLYCERIDE DIETS    FOODS TO USE   MEATS, FISH Choose lean meats (chicken, [place], veal, and non-fatty cuts of beef with excess fat trimmed; one serving = 3 oz of cooked meat). Also, fresh or frozen fish, canned fish packed in water, and shellfish (lobster, crabs, shrimp, and oysters). Limit use to no more than one serving of one of these per week. Shellfish are high in cholesterol but low in saturated fat and should be used sparingly. Meats and fish should be broiled (pan or oven) or baked on a rack.  EGGS Egg substitutes and egg whites (use freely). Egg yolks (limit two per week).  FRUITS Eat three servings of fresh fruit per day (1 serving =  cup). Be sure to have at least one citrus fruit daily. Frozen and canned fruit with no sugar or syrup added may be used.  VEGETABLES Most vegetables are not limited (see next page). One dark-green (string beans, escarole) or one deep yellow (squash) vegetable is recommended daily. Cauliflower, broccoli, and celery, as well as potato skins, are recommended for their fiber content. (Fiber is associated with cholesterol reduction) It is preferable to steam vegetables, but they may be boiled, strained, or braised with polyunsaturated vegetable oil (see below).  BEANS Dried peas or beans (1 serving =  cup) may be used as a bread substitute.  NUTS Almonds, walnuts, and peanuts may be used sparingly  (1 serving = 1 Tablespoonful). Use pumpkin, sesame, or sunflower seeds.  BREADS, GRAINS One roll or one slice of whole grain or enriched bread may be used, or three soda crackers or four pieces of melba toast as a substitute. Spaghetti, rice or noodles ( cup) or  large ear of corn may be used as a bread substitute. In preparing these foods do not use butter or shortening, use soft margarine. Also use egg and sugar substitutes.  Choose high fiber grains, such as oats and whole wheat.  CEREALS Use  cup of hot cereal or  cup of  cold cereal per day. Add a sugar substitute if desired, with 99% fat free or skim milk.  MILK PRODUCTS Always use 99% fat free or skim milk, dairy products such as low fat cheeses (farmer's uncreamed diet cottage), low-fat yogurt, and powdered skim milk.  FATS, OILS Use soft (not stick) margarine; vegetable oils that are high in polyunsaturated fats (such as safflower, sunflower, soybean, corn, and cottonseed). Always refrigerate meat drippings to harden the fat and remove it before preparing gravies  DESSERTS, SNACKS Limit to two servings per day; substitute each serving for a bread/cereal serving: ice milk, water sherbet (1/4 cup); unflavored gelatin or gelatin flavored with sugar substitute (1/3 cup); pudding prepared with skim milk (1/2 cup); egg white souffls; unbuttered popcorn (1  cups). Substitute carob for chocolate.  BEVERAGES Fresh fruit juices (limit 4 oz per day); black coffee, plain or herbal teas; soft drinks with sugar substitutes; club soda, preferably salt-free; cocoa made with skim milk or nonfat dried milk and water (sugar substitute added if desired); clear broth. Alcohol: limit two servings per day (see second page).  MISCELLANEOUS  You may use the following freely: vinegar, spices, herbs, nonfat bouillon, mustard, Worcestershire sauce, soy sauce, flavoring essence.                  GUIDELINES FOR  LOW-CHOLESTEROL, LOW TRIGLYCERIDE DIETS    FOODS TO AVOID   MEATS, FISH Marbled beef, pork, bacon, sausage, and other pork products; fatty  fowl (duck, goose); skin and fat of turkey and chicken; processed meats; luncheon meats (salami, bologna); frankfurters and fast-food hamburgers (theyre loaded with fat); organ meats (kidneys, liver); canned fish packed in oil.  °EGGS Limit egg yolks to two per week.   °FRUITS Coconuts (rich in saturated fats).  °VEGETABLES Avoid avocados. Starchy vegetables (potatoes, corn, lima beans, dried peas, beans) may be used only if  substitutes for a serving of bread or cereal. (Baked potato skin, however, is desirable for its fiber content.  °BEANS Commercial baked beans with sugar and/or pork added.  °NUTS Avoid nuts.  Limit peanuts and walnuts to one tablespoonful per day.  °BREADS, GRAINS Any baked goods with shortening and/or sugar. Commercial mixes with dried eggs and whole milk. Avoid sweet rolls, doughnuts, breakfast pastries (Danish), and sweetened packaged cereals (the added sugar converts readily to triglycerides).  °MILK PRODUCTS Whole milk and whole-milk packaged goods; cream; ice cream; whole-milk puddings, yogurt, or cheeses; nondairy cream substitutes.  °FATS, OILS Butter, lard, animal fats, bacon drippings, gravies, cream sauces as well as palm and coconut oils. All these are high in saturated fats. Examine labels on cholesterol free products for hydrogenated fats. (These are oils that have been hardened into solids and in the process have become saturated.)  °DESSERTS, SNACKS Fried snack foods like potato chips; chocolate; candies in general; jams, jellies, syrups; whole- milk puddings; ice cream and milk sherbets; hydrogenated peanut butter.  °BEVERAGES Sugared fruit juices and soft drinks; cocoa made with whole milk and/or sugar. When using alcohol (1 oz liquor, 5 oz beer, or 2 ½ oz dry table wine per serving), one serving must be substituted for one bread or cereal serving (limit, two servings of alcohol per day).  ° SPECIAL NOTES  °  Remember that even non-limited foods should be used in moderation. °While on a cholesterol-lowering diet, be sure to avoid animal fats and marbled meats. °3. While on a triglyceride-lowering diet, be sure to avoid sweets and to control the amount of carbohydrates you eat (starchy foods such as flour, bread, potatoes).While on a tri-glyceride-lowering diet, be sure to avoid sweets °Buy a good low-fat cookbook, such as the one published by the American Heart Association. °Consult your physician  if you have any questions.  ° ° ° ° ° ° ° ° ° ° ° ° ° °[facility] Low Glycemic Diet Plan ° ° °Low Glycemic Foods (20-49) Moderate Glycemic Foods (50-69) High Glycemic Foods (70-100)  °    °Breakfast Creals Breakfast Cereals Breakfast Cereals  °All Bran All-Bran Fruit'n Oats  ° Bran Buds Bran Chex  ° Cheerios Corn chex  °  °Fiber One Oatmeal (not instant)  ° Just Right Mini-Wheats  ° Corn Flakes Cream of Wheat  °  °Oat Bran Special K Swiss Muesli  ° Grape Nuts Grape Nut Flakes  °  °  Grits Nutri-Grain  °  °Fruits and fruit juice: Fruits Puffed Rice Puffed Wheat  °  °(Limit to 1-2 Servings per day) Banana (under-ride) Dates  ° Rice Chex Rice Krispies  °  °Apples Apricots (fresh/dried)  ° Figs Grapes  ° Shredded Wheat Team  °  °Blackberries Blueberries  ° Kiwi Mango  ° Total   °  °Cherries Cranberries  ° Oranges Raisins  °   °Peaches Pears  °  Fruits  °Plums Prunes  ° Fruit Juices Pineapple Watermelon  °  °Grapefruit Raspberries  ° Cranberry Juice Orange Juice  ° Banana (over-ripe)   °  °Strawberries Tangerines  °    °  Apple Juice Grapefruit Juice  ° Beans and Legumes Beverages  °Tomato Juice   ° [place]-type baked beans Sodas, sweet tea, pineapple juice  ° Canned pinto, kidney, or navy beans   °Beans and Legumes (fresh-cooked) Green peas Vegetables  °Black-eyed peas Butter Beans  °  Potato, baked, boiled, fried, mashed  °Chick peas Lentils  ° Vegetables French fries  °Green beans Lima beans  ° Beets Carrots  ° Canned or frozen corn  °Kidney beans Navy beans  ° Sweet potato Yam  ° Parsnips  °Pinto beans Snow peas  ° Corn on the cob Winter squash  °    °Non-starchy vegetables Grains Breads  °Asparagus, avocado, broccoli, cabbage Cornmeal Rice, brown  ° Most breads (white and whole grain)  °cauliflower, celery, cucumber, greens Rice, white Couscous  ° Bagels Bread sticks  °  °lettuce, mushrooms, peppers, tomatoes  Bread stuffing Kaiser roll  °  °okra, onions, spinach, summer squash Pasta Dinner rolls  ° Macaroni  Pizza, cheese  °   °Grains Ravioli, meat filled Spaghetti, white  ° Grains  °Barley Bulgur  °  Rice, instant Tapioca, with milk  °  °Rye Wild rice  ° Nuts   ° Cashews Macadamia  ° Candy and most cookies  °Nuts and oils    °Almonds, peanuts, sunflower seeds Snacks Snacks  °hazelnuts, pecans, walnuts Chocolate Ice cream, lowfat  ° Donuts Corn chips  °  °Oils that are liquid at room temperature Muffin Popcorn  ° Jelly beans Pretzels  °  °  Pastries  °Dairy, fish, meat, soy, and eggs    °Milk, skim Lowfat cheese  °  Restaurant and ethnic foods  °Yogurt, lowfat, fruit sugar sweetened  Most Chinese food (sugar in stir fry  °  or wok sauce)  °Lean red meat Fish  °  Teriyaki-style meats and vegetables  °Skinless chicken and turkey, shellfish    °    °Egg whites (up to 3 daily), Soy Products    °Egg yolks (up to 7 or _____ per week)    °  °

## 2023-01-14 NOTE — Progress Notes (Signed)
Date:  01/14/2023   Name:  Catherine Pena   DOB:  25-Feb-1946   MRN:  [id number]   Chief Complaint: Hypertension  Hypertension This is a chronic problem. The current episode started in the past 7 days. The problem has been gradually improving since onset. The problem is controlled. Pertinent negatives include no anxiety, blurred vision, chest pain, headaches, malaise/fatigue, neck pain, orthopnea, palpitations, peripheral edema, PND, shortness of breath or sweats. Risk factors for coronary artery disease include dyslipidemia. Past treatments include ACE inhibitors. The current treatment provides moderate improvement. There are no compliance problems.  There is no history of CAD/MI or CVA. There is no history of chronic renal disease, a hypertension causing med or renovascular disease.    Lab Results  Component Value Date   NA 143 12/27/2021   K 5.0 12/27/2021   CO2 24 12/27/2021   GLUCOSE 85 12/27/2021   BUN 17 12/27/2021   CREATININE 0.74 12/27/2021   CALCIUM 9.6 12/27/2021   EGFR 85 12/27/2021   GFRNONAA 83 01/06/2021   Lab Results  Component Value Date   CHOL 171 12/27/2021   HDL 45 12/27/2021   LDLCALC 105 (H) 12/27/2021   TRIG 116 12/27/2021   CHOLHDL 3.2 10/24/2017   No results found for: "TSH" No results found for: "HGBA1C" Lab Results  Component Value Date   WBC 5.1 08/06/2016   HGB 13.2 08/06/2016   HCT 37.3 08/06/2016   MCV 82.7 08/06/2016   PLT 137 (L) 08/06/2016   No results found for: "ALT", "AST", "GGT", "ALKPHOS", "BILITOT" No results found for: "25OHVITD2", "25OHVITD3", "VD25OH"   Review of Systems  Constitutional: Negative.  Negative for chills, fatigue, fever, malaise/fatigue and unexpected weight change.  HENT:  Negative for congestion, ear discharge, ear pain, rhinorrhea, sinus pressure, sneezing and sore throat.   Eyes:  Negative for blurred vision.  Respiratory:  Negative for cough, shortness of breath, wheezing and stridor.   Cardiovascular:   Negative for chest pain, palpitations, orthopnea, leg swelling and PND.  Gastrointestinal:  Negative for abdominal pain, blood in stool and nausea.  Musculoskeletal:  Negative for arthralgias, back pain, myalgias and neck pain.  Skin:  Negative for rash.  Neurological:  Negative for dizziness, weakness and headaches.  Hematological:  Negative for adenopathy. Does not bruise/bleed easily.  Psychiatric/Behavioral:  Negative for dysphoric mood. The patient is not nervous/anxious.     Patient Active Problem List   Diagnosis Date Noted   Benign essential HTN 04/14/2015   Adiposity 04/14/2015   Borderline diabetes 04/14/2015    No Known Allergies  Past Surgical History:  Procedure Laterality Date   CATARACT EXTRACTION, BILATERAL Bilateral 10/2016 and 11/2016   EYE SURGERY      Social History   Tobacco Use   Smoking status: Former    Packs/day: 0.25    Years: 20.00    Total pack years: 5.00    Types: Cigarettes   Smokeless tobacco: Never  Vaping Use   Vaping Use: Never used  Substance Use Topics   Alcohol use: No   Drug use: No     Medication list has been reviewed and updated.  Current Meds  Medication Sig   albuterol (VENTOLIN HFA) 108 (90 Base) MCG/ACT inhaler Inhale 2 puffs into the lungs every 4 (four) hours as needed for wheezing or shortness of breath.   ibuprofen (ADVIL) 600 MG tablet Take 1 tablet (600 mg total) by mouth every 6 (six) hours as needed.   ipratropium (ATROVENT) 0.06 %  nasal spray Place 2 sprays into both nostrils 4 (four) times daily.   latanoprost (XALATAN) 0.005 % ophthalmic solution Place 1 drop into both eyes at bedtime.   lisinopril (ZESTRIL) 10 MG tablet TAKE 1 TABLET BY MOUTH EVERY DAY   promethazine-dextromethorphan (PROMETHAZINE-DM) 6.25-15 MG/5ML syrup Take 5 mLs by mouth 4 (four) times daily as needed for cough.   Spacer/Aero-Holding Chambers (AEROCHAMBER MV) inhaler Use as instructed   timolol (BETIMOL) 0.5 % ophthalmic solution Place 1  drop into the right eye daily.       01/14/2023    3:49 PM 07/05/2022    9:28 AM 12/27/2021   10:28 AM 06/23/2021   10:31 AM  GAD 7 : Generalized Anxiety Score  Nervous, Anxious, on Edge 0 0 0 0  Control/stop worrying 0 0 0 0  Worry too much - different things 0 0 0 0  Trouble relaxing 0 0 0 0  Restless 0 0 0 0  Easily annoyed or irritable 0 0 0 0  Afraid - awful might happen 0 0 0 0  Total GAD 7 Score 0 0 0 0  Anxiety Difficulty Not difficult at all Not difficult at all Not difficult at all        01/14/2023    3:48 PM 07/05/2022    9:28 AM 12/27/2021   10:28 AM  Depression screen PHQ 2/9  Decreased Interest 0 0 0  Down, Depressed, Hopeless 0 0 0  PHQ - 2 Score 0 0 0  Altered sleeping 0 0 0  Tired, decreased energy 0 0 0  Change in appetite 0 0 0  Feeling bad or failure about yourself  0 0 0  Trouble concentrating 0 0 0  Moving slowly or fidgety/restless 0 0 0  Suicidal thoughts 0 0 0  PHQ-9 Score 0 0 0  Difficult doing work/chores Not difficult at all Not difficult at all Not difficult at all    BP Readings from Last 3 Encounters:  01/14/23 126/76  01/01/23 (!) 179/93  11/24/22 (!) 161/79    Physical Exam Vitals and nursing note reviewed. Exam conducted with a chaperone present.  Constitutional:      General: She is not in acute distress.    Appearance: She is not diaphoretic.  HENT:     Head: Normocephalic and atraumatic.     Right Ear: Tympanic membrane and external ear normal.     Left Ear: Tympanic membrane and external ear normal.     Nose: Nose normal.     Mouth/Throat:     Mouth: Mucous membranes are moist.  Eyes:     General:        Right eye: No discharge.        Left eye: No discharge.     Conjunctiva/sclera: Conjunctivae normal.     Pupils: Pupils are equal, round, and reactive to light.  Neck:     Thyroid: No thyromegaly.     Vascular: No JVD.  Cardiovascular:     Rate and Rhythm: Normal rate and regular rhythm.     Heart sounds: Normal heart  sounds. No murmur heard.    No friction rub. No gallop.  Pulmonary:     Effort: Pulmonary effort is normal.     Breath sounds: Normal breath sounds. No wheezing, rhonchi or rales.  Abdominal:     General: Bowel sounds are normal.     Palpations: Abdomen is soft. There is no mass.     Tenderness: There is no abdominal  tenderness. There is no guarding.  Musculoskeletal:        General: Normal range of motion.     Cervical back: Normal range of motion and neck supple.  Lymphadenopathy:     Cervical: No cervical adenopathy.  Skin:    General: Skin is warm and dry.     Findings: No bruising or erythema.  Neurological:     Mental Status: She is alert.     Deep Tendon Reflexes: Reflexes are normal and symmetric.     Wt Readings from Last 3 Encounters:  01/14/23 192 lb (87.1 kg)  07/05/22 192 lb (87.1 kg)  12/27/21 193 lb (87.5 kg)    BP 126/76   Pulse 72   Ht 5\' 4"  (1.626 m)   Wt 192 lb (87.1 kg)   SpO2 96%   BMI 32.96 kg/m   Assessment and Plan:  1. Benign essential HTN Chronic.  Controlled.  Stable.  Blood pressure today is 126/76.  Asymptomatic.  Tolerating medication well.  Continue lisinopril 10 mg once a day.  Will check CMP for electrolytes and GFR. - lisinopril (ZESTRIL) 10 MG tablet; Take 1 tablet (10 mg total) by mouth daily.  Dispense: 90 tablet; Refill: 1 - Comprehensive Metabolic Panel (CMET)  2. Moderate mixed hyperlipidemia not requiring statin therapy Chronic.  Diet controlled.  Stable.  Reemphasized low-cholesterol low triglyceride choices.  Check lipid panel for current level of control. - Lipid Panel With LDL/HDL Ratio    , MD

## 2023-01-15 LAB — COMPREHENSIVE METABOLIC PANEL
ALT: 17 IU/L (ref 0–32)
AST: 17 IU/L (ref 0–40)
Albumin/Globulin Ratio: 1.6 (ref 1.2–2.2)
Albumin: 4.4 g/dL (ref 3.8–4.8)
Alkaline Phosphatase: 65 IU/L (ref 44–121)
BUN/Creatinine Ratio: 18 (ref 12–28)
BUN: 12 mg/dL (ref 8–27)
Bilirubin Total: 0.4 mg/dL (ref 0.0–1.2)
CO2: 24 mmol/L (ref 20–29)
Calcium: 9.4 mg/dL (ref 8.7–10.3)
Chloride: 104 mmol/L (ref 96–106)
Creatinine, Ser: 0.67 mg/dL (ref 0.57–1.00)
Globulin, Total: 2.7 g/dL (ref 1.5–4.5)
Glucose: 87 mg/dL (ref 70–99)
Potassium: 4.3 mmol/L (ref 3.5–5.2)
Sodium: 142 mmol/L (ref 134–144)
Total Protein: 7.1 g/dL (ref 6.0–8.5)
eGFR: 92 mL/min/{1.73_m2} (ref >59)

## 2023-01-15 LAB — LIPID PANEL WITH LDL/HDL RATIO
Cholesterol, Total: 191 mg/dL (ref 100–199)
HDL: 45 mg/dL (ref >39)
LDL Chol Calc (NIH): 121 mg/dL — ABNORMAL HIGH (ref 0–99)
LDL/HDL Ratio: 2.7 ratio (ref 0.0–3.2)
Triglycerides: 138 mg/dL (ref 0–149)
VLDL Cholesterol Cal: 25 mg/dL (ref 5–40)

## 2023-03-10 ENCOUNTER — Ambulatory Visit
Admission: EM | Admit: 2023-03-10 | Discharge: 2023-03-10 | Disposition: A | Discharge status: Home / Self Care | Payer: BC Managed Care – PPO | Attending: Family Medicine | Admitting: Family Medicine

## 2023-03-10 DIAGNOSIS — J302 Other seasonal allergic rhinitis: Secondary | ICD-10-CM | POA: Diagnosis not present

## 2023-03-10 DIAGNOSIS — Z20822 Contact with and (suspected) exposure to covid-19: Secondary | ICD-10-CM

## 2023-03-10 LAB — RESP PANEL BY RT-PCR (RSV, FLU A&B, COVID)  RVPGX2
Influenza A by PCR: NEGATIVE
Influenza B by PCR: NEGATIVE
Resp Syncytial Virus by PCR: NEGATIVE
SARS Coronavirus 2 by RT PCR: NEGATIVE

## 2023-03-10 MED ORDER — LORATADINE 10 MG PO TABS: 10.0000 mg | ORAL_TABLET | Freq: Every day | ORAL | 2 refills | Status: AC

## 2023-03-10 NOTE — Discharge Instructions (Addendum)
Your COVID, influenza and RSV tests are negative. You likely have seasonal allergies. Stop by the pharmacy to pick up your allergy medication.

## 2023-03-10 NOTE — ED Provider Notes (Signed)
[facility]    CSN: [id number] Arrival date & time: 03/10/23  0857      History   Chief Complaint Chief Complaint  Patient presents with   Nasal Congestion    HPI Catherine Pena is a 77 y.o. female.   HPI   Catherine Pena presents for congestion and sneezing for the past 3 days. Flonase and Claritin helped her symptoms.Feels like it may have allergies but the last time the it was COVID. Requests COVID and influenza tests as she had to attend a funeral today.    Fever : no  Chills: no Sore throat: no   Cough: slight Sputum: no Nasal congestion: yes Rhinorrhea: yes Myalgias: no Appetite: normal  Hydration: normal  Abdominal pain: no Shortness of breath: no  Chest pain: no  Nausea: no Vomiting: no Diarrhea: resolved Rash: No Sleep disturbance: no Headache: no      Past Medical History:  Diagnosis Date   Glaucoma    Hypertension     Patient Active Problem List   Diagnosis Date Noted   Benign essential HTN 04/14/2015   Adiposity 04/14/2015   Borderline diabetes 04/14/2015    Past Surgical History:  Procedure Laterality Date   CATARACT EXTRACTION, BILATERAL Bilateral 10/2016 and 11/2016   EYE SURGERY      OB History   No obstetric history on file.      Home Medications    Prior to Admission medications   Medication Sig Start Date End Date Taking? Authorizing Provider  albuterol (VENTOLIN HFA) 108 (90 Base) MCG/ACT inhaler Inhale 2 puffs into the lungs every 4 (four) hours as needed for wheezing or shortness of breath. 01/01/23  Yes Melynda Ripple, MD  ibuprofen (ADVIL) 600 MG tablet Take 1 tablet (600 mg total) by mouth every 6 (six) hours as needed. 07/05/22  Yes Juline Patch, MD  ipratropium (ATROVENT) 0.06 % nasal spray Place 2 sprays into both nostrils 4 (four) times daily. 01/01/23  Yes Melynda Ripple, MD  latanoprost (XALATAN) 0.005 % ophthalmic solution Place 1 drop into both eyes at bedtime. 09/30/17  Yes [provider]  lisinopril (ZESTRIL) 10 MG tablet Take 1 tablet (10 mg total) by mouth daily. 01/14/23  Yes Juline Patch, MD  loratadine (CLARITIN) 10 MG tablet Take 1 tablet (10 mg total) by mouth daily. 03/10/23  Yes Orel Hord, Ronnette Juniper, DO  Spacer/Aero-Holding Chambers (AEROCHAMBER MV) inhaler Use as instructed 01/01/23  Yes Melynda Ripple, MD  timolol (BETIMOL) 0.5 % ophthalmic solution Place 1 drop into the right eye daily.   Yes [provider]    Family History Family History  Problem Relation Age of Onset   Ovarian cancer Mother    Diabetes Father     Social History Social History   Tobacco Use   Smoking status: Former    Packs/day: 0.25    Years: 20.00    Additional pack years: 0.00    Total pack years: 5.00    Types: Cigarettes   Smokeless tobacco: Never  Vaping Use   Vaping Use: Never used  Substance Use Topics   Alcohol use: No   Drug use: No     Allergies   Patient has no known allergies.   Review of Systems Review of Systems: negative unless otherwise stated in HPI.      Physical Exam Triage Vital Signs ED Triage Vitals  Enc Vitals Group     BP 03/10/23 0904 123/81     Pulse Rate 03/10/23 0904 85  Resp --      Temp 03/10/23 0904 98.7 F (37.1 C)     Temp Source 03/10/23 0904 Oral     SpO2 03/10/23 0904 95 %     Weight 03/10/23 0903 190 lb (86.2 kg)     Height 03/10/23 0903 5\' 4"  (1.626 m)     Head Circumference --      Peak Flow --      Pain Score 03/10/23 0903 0     Pain Loc --      Pain Edu? --      Excl. in [place]? --    No data found.  Updated Vital Signs BP 123/81 (BP Location: Left Arm)   Pulse 85   Temp 98.7 F (37.1 C) (Oral)   Ht 5\' 4"  (1.626 m)   Wt 86.2 kg   SpO2 95%   BMI 32.61 kg/m   Visual Acuity Right Eye Distance:   Left Eye Distance:   Bilateral Distance:    Right Eye Near:   Left Eye Near:    Bilateral Near:     Physical Exam GEN:     alert, well appearing female in no distress    HENT:  mucus membranes  moist, oropharyngeal without lesions or exudate, no  oropharyngeal erythema,  moderate pale edematous turbinates, clear nasal discharge, bilateral TM normal EYES:   pupils equal and reactive, no scleral injection or discharge NECK:  ROM at baseline, no lymphadenopathy RESP:  no increased work of breathing, clear to auscultation bilaterally CVS:   regular rate and rhythm Skin:   warm and dry, no rash on visible skin    UC Treatments / Results  Labs (all labs ordered are listed, but only abnormal results are displayed) Labs Reviewed  RESP PANEL BY RT-PCR (RSV, FLU A&B, COVID)  [id number]    EKG   Radiology No results found.  Procedures Procedures (including critical care time)  Medications Ordered in UC Medications - No data to display  Initial Impression / Assessment and Plan / UC Course  I have reviewed the triage vital signs and the nursing notes.  Pertinent labs & imaging results that were available during my care of the patient were reviewed by me and considered in my medical decision making (see chart for details).       Pt is a 77 y.o. female who presents for 3 days of respiratory symptoms. Catherine Pena is afebrile here without recent antipyretics. Satting adequatley on room air. Overall pt is well appearing, well hydrated, without respiratory distress. Pulmonary exam is unremarkable.  RSV, COVID and influenza testing obtained and were negative. History consistent with seasonal allergies vs viral upper respiratory illness. Discussed symptomatic treatment.  Explained lack of efficacy of antibiotics in viral disease.  Typical duration of symptoms discussed. Start Claritin for seasonal allergies.   Return and ED precautions given and voiced understanding. Discussed MDM, treatment plan and plan for follow-up with patient who agrees with plan.     Final Clinical Impressions(s) / UC Diagnoses   Final diagnoses:  Seasonal allergies  Encounter for laboratory testing for COVID-19  virus     Discharge Instructions      Your COVID, influenza and RSV tests are negative. You likely have seasonal allergies. Stop by the pharmacy to pick up your allergy medication.      ED Prescriptions     Medication Sig Dispense Auth. Provider   loratadine (CLARITIN) 10 MG tablet Take 1 tablet (10 mg total) by mouth daily. 30 tablet  Lyndee Hensen, DO      PDMP not reviewed this encounter.   Lyndee Hensen, DO 03/10/23 [phone]

## 2023-03-10 NOTE — ED Triage Notes (Signed)
Pt c/o nasal congestion, sneezing x3days   Pt denies chest congestion, facial pain, cough, headache, or dizziness  Pt asks for covid and flu testing.

## 2023-04-08 DIAGNOSIS — H43812 Vitreous degeneration, left eye: Secondary | ICD-10-CM | POA: Diagnosis not present

## 2023-04-08 DIAGNOSIS — H401131 Primary open-angle glaucoma, bilateral, mild stage: Secondary | ICD-10-CM | POA: Diagnosis not present

## 2023-04-08 DIAGNOSIS — Z961 Presence of intraocular lens: Secondary | ICD-10-CM | POA: Diagnosis not present

## 2023-04-08 DIAGNOSIS — H5213 Myopia, bilateral: Secondary | ICD-10-CM | POA: Diagnosis not present

## 2023-07-15 ENCOUNTER — Encounter: Payer: Self-pay | Admitting: Family Medicine

## 2023-07-15 ENCOUNTER — Ambulatory Visit: Payer: BC Managed Care – PPO | Admitting: Family Medicine

## 2023-07-15 VITALS — BP 120/78 | HR 72 | Ht 64.0 in | Wt 193.0 lb

## 2023-07-15 DIAGNOSIS — M170 Bilateral primary osteoarthritis of knee: Secondary | ICD-10-CM | POA: Diagnosis not present

## 2023-07-15 DIAGNOSIS — I10 Essential (primary) hypertension: Secondary | ICD-10-CM

## 2023-07-15 MED ORDER — IBUPROFEN 600 MG PO TABS: 600.0000 mg | ORAL_TABLET | Freq: Four times a day (QID) | ORAL | 5 refills | Status: AC | PRN

## 2023-07-15 MED ORDER — LISINOPRIL 10 MG PO TABS: 10.0000 mg | ORAL_TABLET | Freq: Every day | ORAL | 1 refills | Status: DC

## 2023-07-15 NOTE — Progress Notes (Signed)
Date:  07/15/2023   Name:  Catherine Pena   DOB:  1946/07/21   MRN:  [id number]   Chief Complaint: Hypertension and Arthritis (Refill ibuprofen)  Hypertension This is a chronic problem. The current episode started more than 1 year ago. The problem has been gradually improving since onset. The problem is controlled. Pertinent negatives include no anxiety, chest pain, orthopnea, palpitations, PND or shortness of breath. There are no associated agents to hypertension. There are no known risk factors for coronary artery disease. Past treatments include ACE inhibitors. The current treatment provides moderate improvement. There are no compliance problems.  There is no history of CAD/MI or CVA. There is no history of chronic renal disease, a hypertension causing med or renovascular disease.  Arthritis Presents for follow-up visit. She complains of pain and stiffness. Affected locations include the right knee and left knee. Associated symptoms include fatigue. Pertinent negatives include no pain at night or uveitis.    Lab Results  Component Value Date   NA 142 01/14/2023   K 4.3 01/14/2023   CO2 24 01/14/2023   GLUCOSE 87 01/14/2023   BUN 12 01/14/2023   CREATININE 0.67 01/14/2023   CALCIUM 9.4 01/14/2023   EGFR 92 01/14/2023   GFRNONAA 83 01/06/2021   Lab Results  Component Value Date   CHOL 191 01/14/2023   HDL 45 01/14/2023   LDLCALC 121 (H) 01/14/2023   TRIG 138 01/14/2023   CHOLHDL 3.2 10/24/2017   No results found for: "TSH" No results found for: "HGBA1C" Lab Results  Component Value Date   WBC 5.1 08/06/2016   HGB 13.2 08/06/2016   HCT 37.3 08/06/2016   MCV 82.7 08/06/2016   PLT 137 (L) 08/06/2016   Lab Results  Component Value Date   ALT 17 01/14/2023   AST 17 01/14/2023   ALKPHOS 65 01/14/2023   BILITOT 0.4 01/14/2023   No results found for: "25OHVITD2", "25OHVITD3", "VD25OH"   Review of Systems  Constitutional:  Positive for fatigue.  HENT:  Negative for  postnasal drip, sinus pressure and trouble swallowing.   Eyes:  Negative for visual disturbance.  Respiratory:  Negative for chest tightness, shortness of breath and wheezing.   Cardiovascular:  Negative for chest pain, palpitations, orthopnea and PND.  Gastrointestinal:  Negative for abdominal pain and blood in stool.  Endocrine: Negative for polydipsia and polyuria.  Genitourinary:  Negative for difficulty urinating and menstrual problem.  Musculoskeletal:  Positive for arthralgias, arthritis and stiffness.    Patient Active Problem List   Diagnosis Date Noted   Benign essential HTN 04/14/2015   Adiposity 04/14/2015   Borderline diabetes 04/14/2015    No Known Allergies  Past Surgical History:  Procedure Laterality Date   CATARACT EXTRACTION, BILATERAL Bilateral 10/2016 and 11/2016   EYE SURGERY      Social History   Tobacco Use   Smoking status: Former    Current packs/day: 0.25    Average packs/day: 0.3 packs/day for 20.0 years (5.0 ttl pk-yrs)    Types: Cigarettes   Smokeless tobacco: Never  Vaping Use   Vaping status: Never Used  Substance Use Topics   Alcohol use: No   Drug use: No     Medication list has been reviewed and updated.  Current Meds  Medication Sig   albuterol (VENTOLIN HFA) 108 (90 Base) MCG/ACT inhaler Inhale 2 puffs into the lungs every 4 (four) hours as needed for wheezing or shortness of breath.   ibuprofen (ADVIL) 600 MG tablet  Take 1 tablet (600 mg total) by mouth every 6 (six) hours as needed.   ipratropium (ATROVENT) 0.06 % nasal spray Place 2 sprays into both nostrils 4 (four) times daily.   latanoprost (XALATAN) 0.005 % ophthalmic solution Place 1 drop into both eyes at bedtime.   lisinopril (ZESTRIL) 10 MG tablet Take 1 tablet (10 mg total) by mouth daily.   loratadine (CLARITIN) 10 MG tablet Take 1 tablet (10 mg total) by mouth daily.   Spacer/Aero-Holding Chambers (AEROCHAMBER MV) inhaler Use as instructed   timolol (BETIMOL) 0.5 %  ophthalmic solution Place 1 drop into the right eye daily.   timolol (TIMOPTIC) 0.25 % ophthalmic solution 1 drop every morning.       07/15/2023    2:01 PM 01/14/2023    3:49 PM 07/05/2022    9:28 AM 12/27/2021   10:28 AM  GAD 7 : Generalized Anxiety Score  Nervous, Anxious, on Edge 0 0 0 0  Control/stop worrying 0 0 0 0  Worry too much - different things 0 0 0 0  Trouble relaxing 0 0 0 0  Restless 0 0 0 0  Easily annoyed or irritable 0 0 0 0  Afraid - awful might happen 0 0 0 0  Total GAD 7 Score 0 0 0 0  Anxiety Difficulty Not difficult at all Not difficult at all Not difficult at all Not difficult at all       07/15/2023    2:00 PM 01/14/2023    3:48 PM 07/05/2022    9:28 AM  Depression screen PHQ 2/9  Decreased Interest 0 0 0  Down, Depressed, Hopeless 0 0 0  PHQ - 2 Score 0 0 0  Altered sleeping 0 0 0  Tired, decreased energy 0 0 0  Change in appetite 0 0 0  Feeling bad or failure about yourself  0 0 0  Trouble concentrating 0 0 0  Moving slowly or fidgety/restless 0 0 0  Suicidal thoughts 0 0 0  PHQ-9 Score 0 0 0  Difficult doing work/chores Not difficult at all Not difficult at all Not difficult at all    BP Readings from Last 3 Encounters:  07/15/23 120/78  03/10/23 123/81  01/14/23 126/76    Physical Exam Vitals and nursing note reviewed. Exam conducted with a chaperone present.  Constitutional:      General: She is not in acute distress.    Appearance: She is not diaphoretic.  HENT:     Head: Normocephalic and atraumatic.     Right Ear: Tympanic membrane and external ear normal.     Left Ear: Tympanic membrane and external ear normal.     Nose: Nose normal. No congestion or rhinorrhea.     Mouth/Throat:     Mouth: Mucous membranes are moist.  Eyes:     General:        Right eye: No discharge.        Left eye: No discharge.     Conjunctiva/sclera: Conjunctivae normal.     Pupils: Pupils are equal, round, and reactive to light.  Neck:     Thyroid:  No thyromegaly.     Vascular: No JVD.  Cardiovascular:     Rate and Rhythm: Normal rate and regular rhythm.     Heart sounds: Normal heart sounds. No murmur heard.    No friction rub. No gallop.  Pulmonary:     Effort: Pulmonary effort is normal.     Breath sounds: Normal breath sounds.  No wheezing, rhonchi or rales.  Abdominal:     General: Bowel sounds are normal.     Palpations: Abdomen is soft. There is no mass.     Tenderness: There is no abdominal tenderness. There is no guarding.  Musculoskeletal:        General: Normal range of motion.     Cervical back: Normal range of motion and neck supple. No tenderness.  Lymphadenopathy:     Cervical: No cervical adenopathy.  Skin:    General: Skin is warm and dry.  Neurological:     Mental Status: She is alert.     Deep Tendon Reflexes: Reflexes are normal and symmetric.     Wt Readings from Last 3 Encounters:  07/15/23 193 lb (87.5 kg)  03/10/23 190 lb (86.2 kg)  01/14/23 192 lb (87.1 kg)    BP 120/78   Pulse 72   Ht 5\' 4"  (1.626 m)   Wt 193 lb (87.5 kg)   SpO2 94%   BMI 33.13 kg/m   Assessment and Plan:  1. Benign essential HTN Chronic.  Controlled.  Stable.  Blood pressure 120/78.  Asymptomatic.  Tolerating medication well.  Continue lisinopril 10 mg once a day.  Will check renal function panel for GFR and electrolytes. - lisinopril (ZESTRIL) 10 MG tablet; Take 1 tablet (10 mg total) by mouth daily.  Dispense: 90 tablet; Refill: 1 - Renal Function Panel  2. Primary osteoarthritis of both knees Chronic.  Episodic.  Relatively stable.  On an as-needed basis patient takes ibuprofen 600 mg and not on a daily basis.  Patient may take it under the circumstances but not on continue with scheduled. - ibuprofen (ADVIL) 600 MG tablet; Take 1 tablet (600 mg total) by mouth every 6 (six) hours as needed.  Dispense: 30 tablet; Refill: 5    Elizabeth Sauer, MD

## 2023-07-16 LAB — RENAL FUNCTION PANEL
Albumin: 4.4 g/dL (ref 3.8–4.8)
BUN/Creatinine Ratio: 15 (ref 12–28)
BUN: 13 mg/dL (ref 8–27)
CO2: 21 mmol/L (ref 20–29)
Calcium: 9.7 mg/dL (ref 8.7–10.3)
Chloride: 105 mmol/L (ref 96–106)
Creatinine, Ser: 0.84 mg/dL (ref 0.57–1.00)
Glucose: 113 mg/dL — ABNORMAL HIGH (ref 70–99)
Phosphorus: 3.6 mg/dL (ref 3.0–4.3)
Potassium: 4.3 mmol/L (ref 3.5–5.2)
Sodium: 142 mmol/L (ref 134–144)
eGFR: 72 mL/min/{1.73_m2} (ref >59)

## 2023-08-05 DIAGNOSIS — H5213 Myopia, bilateral: Secondary | ICD-10-CM | POA: Diagnosis not present

## 2023-08-05 DIAGNOSIS — Z961 Presence of intraocular lens: Secondary | ICD-10-CM | POA: Diagnosis not present

## 2023-08-05 DIAGNOSIS — H401131 Primary open-angle glaucoma, bilateral, mild stage: Secondary | ICD-10-CM | POA: Diagnosis not present

## 2023-08-05 DIAGNOSIS — H43812 Vitreous degeneration, left eye: Secondary | ICD-10-CM | POA: Diagnosis not present

## 2023-08-08 ENCOUNTER — Encounter: Payer: Self-pay | Admitting: Family Medicine

## 2023-08-08 ENCOUNTER — Ambulatory Visit: Payer: BC Managed Care – PPO | Admitting: Family Medicine

## 2023-08-08 VITALS — BP 128/82 | HR 75 | Ht 64.0 in | Wt 194.0 lb

## 2023-08-08 DIAGNOSIS — R051 Acute cough: Secondary | ICD-10-CM | POA: Diagnosis not present

## 2023-08-08 DIAGNOSIS — U071 COVID-19: Secondary | ICD-10-CM | POA: Diagnosis not present

## 2023-08-08 LAB — POC COVID19 BINAXNOW: SARS Coronavirus 2 Ag: POSITIVE — AB

## 2023-08-08 MED ORDER — PROMETHAZINE-DM 6.25-15 MG/5ML PO SYRP: 5.0000 mL | ORAL_SOLUTION | Freq: Four times a day (QID) | ORAL | 0 refills | Status: DC | PRN

## 2023-08-08 MED ORDER — NIRMATRELVIR/RITONAVIR (PAXLOVID)TABLET: 3.0000 | ORAL_TABLET | Freq: Two times a day (BID) | ORAL | 0 refills | Status: AC

## 2023-08-08 NOTE — Progress Notes (Signed)
Date:  08/08/2023   Name:  Catherine Pena   DOB:  [birth year]   MRN:  [id number]   Chief Complaint: Sinusitis (Runny nose, cough, sore throat from drainage)  Sinusitis This is a new problem. The current episode started yesterday. The problem has been gradually worsening since onset. There has been no fever. The pain is mild. Associated symptoms include congestion, coughing, headaches and sinus pressure. Pertinent negatives include no chills, diaphoresis, ear pain, hoarse voice, shortness of breath or sore throat. Treatments tried: allegra. The treatment provided moderate relief.    Lab Results  Component Value Date   NA 142 07/15/2023   K 4.3 07/15/2023   CO2 21 07/15/2023   GLUCOSE 113 (H) 07/15/2023   BUN 13 07/15/2023   CREATININE 0.84 07/15/2023   CALCIUM 9.7 07/15/2023   EGFR 72 07/15/2023   GFRNONAA 83 01/06/2021   Lab Results  Component Value Date   CHOL 191 01/14/2023   HDL 45 01/14/2023   LDLCALC 121 (H) 01/14/2023   TRIG 138 01/14/2023   CHOLHDL 3.2 10/24/2017   No results found for: "TSH" No results found for: "HGBA1C" Lab Results  Component Value Date   WBC 5.1 08/06/2016   HGB 13.2 08/06/2016   HCT 37.3 08/06/2016   MCV 82.7 08/06/2016   PLT 137 (L) 08/06/2016   Lab Results  Component Value Date   ALT 17 01/14/2023   AST 17 01/14/2023   ALKPHOS 65 01/14/2023   BILITOT 0.4 01/14/2023   No results found for: "25OHVITD2", "25OHVITD3", "VD25OH"   Review of Systems  Constitutional:  Negative for chills and diaphoresis.  HENT:  Positive for congestion, nosebleeds and sinus pressure. Negative for ear pain, hoarse voice and sore throat.   Respiratory:  Positive for cough. Negative for shortness of breath.   Neurological:  Positive for headaches.    Patient Active Problem List   Diagnosis Date Noted   Benign essential HTN 04/14/2015   Adiposity 04/14/2015   Borderline diabetes 04/14/2015    No Known Allergies  Past Surgical History:  Procedure  Laterality Date   CATARACT EXTRACTION, BILATERAL Bilateral 10/2016 and 11/2016   EYE SURGERY      Social History   Tobacco Use   Smoking status: Former    Current packs/day: 0.25    Average packs/day: 0.3 packs/day for 20.0 years (5.0 ttl pk-yrs)    Types: Cigarettes   Smokeless tobacco: Never  Vaping Use   Vaping status: Never Used  Substance Use Topics   Alcohol use: No   Drug use: No     Medication list has been reviewed and updated.  Current Meds  Medication Sig   albuterol (VENTOLIN HFA) 108 (90 Base) MCG/ACT inhaler Inhale 2 puffs into the lungs every 4 (four) hours as needed for wheezing or shortness of breath.   ibuprofen (ADVIL) 600 MG tablet Take 1 tablet (600 mg total) by mouth every 6 (six) hours as needed.   ipratropium (ATROVENT) 0.06 % nasal spray Place 2 sprays into both nostrils 4 (four) times daily.   latanoprost (XALATAN) 0.005 % ophthalmic solution Place 1 drop into both eyes at bedtime.   lisinopril (ZESTRIL) 10 MG tablet Take 1 tablet (10 mg total) by mouth daily.   loratadine (CLARITIN) 10 MG tablet Take 1 tablet (10 mg total) by mouth daily.   Spacer/Aero-Holding Chambers (AEROCHAMBER MV) inhaler Use as instructed   timolol (BETIMOL) 0.5 % ophthalmic solution Place 1 drop into the right eye daily.  timolol (TIMOPTIC) 0.25 % ophthalmic solution 1 drop every morning.       08/08/2023    4:33 PM 07/15/2023    2:01 PM 01/14/2023    3:49 PM 07/05/2022    9:28 AM  GAD 7 : Generalized Anxiety Score  Nervous, Anxious, on Edge 0 0 0 0  Control/stop worrying 0 0 0 0  Worry too much - different things 0 0 0 0  Trouble relaxing 0 0 0 0  Restless 0 0 0 0  Easily annoyed or irritable 0 0 0 0  Afraid - awful might happen 0 0 0 0  Total GAD 7 Score 0 0 0 0  Anxiety Difficulty Not difficult at all Not difficult at all Not difficult at all Not difficult at all       08/08/2023    4:33 PM 07/15/2023    2:00 PM 01/14/2023    3:48 PM  Depression screen PHQ 2/9   Decreased Interest 0 0 0  Down, Depressed, Hopeless 0 0 0  PHQ - 2 Score 0 0 0  Altered sleeping 0 0 0  Tired, decreased energy 0 0 0  Change in appetite 0 0 0  Feeling bad or failure about yourself  0 0 0  Trouble concentrating 0 0 0  Moving slowly or fidgety/restless 0 0 0  Suicidal thoughts 0 0 0  PHQ-9 Score 0 0 0  Difficult doing work/chores Not difficult at all Not difficult at all Not difficult at all    BP Readings from Last 3 Encounters:  08/08/23 128/78  07/15/23 120/78  03/10/23 123/81    Physical Exam Vitals and nursing note reviewed.  HENT:     Head: Normocephalic.     Right Ear: Tympanic membrane and ear canal normal.     Left Ear: Tympanic membrane and ear canal normal.     Nose: Nose normal.  Eyes:     Pupils: Pupils are equal, round, and reactive to light.  Cardiovascular:     Heart sounds: No murmur heard.    No friction rub. No gallop.  Pulmonary:     Effort: Pulmonary effort is normal.     Breath sounds: No wheezing, rhonchi or rales.  Neurological:     Mental Status: She is alert.     Wt Readings from Last 3 Encounters:  08/08/23 194 lb (88 kg)  07/15/23 193 lb (87.5 kg)  03/10/23 190 lb (86.2 kg)    BP 128/78   Pulse 75   Ht 5\' 4"  (1.626 m)   Wt 194 lb (88 kg)   SpO2 98%   BMI 33.30 kg/m   Assessment and Plan: 1. Acute cough New onset.  Persistent.  Patient's recently been to a funeral.  We will check COVID. - POC COVID-19 - promethazine-dextromethorphan (PROMETHAZINE-DM) 6.25-15 MG/5ML syrup; Take 5 mLs by mouth 4 (four) times daily as needed.  Dispense: 118 mL; Refill: 0  2. COVID New onset.  Patient is positive for COVID.  Review of past GFR in July was noted to be 72 and given the patient's age and concomitant medical concerns we will treat with Paxlovid 3 tablets twice a day for 5 days. - nirmatrelvir/ritonavir (PAXLOVID) 20 x 150 MG & 10 x 100MG  TABS; Take 3 tablets by mouth 2 (two) times daily for 5 days. (Take nirmatrelvir  150 mg two tablets twice daily for 5 days and ritonavir 100 mg one tablet twice daily for 5 days) Patient GFR is 72  Dispense:  30 tablet; Refill: 0     Elizabeth Sauer, MD

## 2023-10-24 ENCOUNTER — Telehealth: Payer: Self-pay | Admitting: Family Medicine

## 2023-10-24 NOTE — Telephone Encounter (Signed)
Copied from CRM [id number]. Topic: Medicare AWV >> Oct 24, 2023  1:29 PM Payton Doughty wrote: Reason for CRM: Called LVM 10/23/2023 to schedule Annual Wellness Visit  Verlee Rossetti; Care Guide Ambulatory Clinical Support  l [facility] Direct Dial: [phone]

## 2023-11-22 ENCOUNTER — Ambulatory Visit
Admission: EM | Admit: 2023-11-22 | Discharge: 2023-11-22 | Disposition: A | Discharge status: Home / Self Care | Payer: BC Managed Care – PPO | Attending: Emergency Medicine | Admitting: Emergency Medicine

## 2023-11-22 DIAGNOSIS — R051 Acute cough: Secondary | ICD-10-CM | POA: Insufficient documentation

## 2023-11-22 DIAGNOSIS — J069 Acute upper respiratory infection, unspecified: Secondary | ICD-10-CM | POA: Insufficient documentation

## 2023-11-22 LAB — RESP PANEL BY RT-PCR (FLU A&B, COVID) ARPGX2
Influenza A by PCR: NEGATIVE
Influenza B by PCR: NEGATIVE
SARS Coronavirus 2 by RT PCR: NEGATIVE

## 2023-11-22 MED ORDER — BENZONATATE 100 MG PO CAPS: 200.0000 mg | ORAL_CAPSULE | Freq: Three times a day (TID) | ORAL | 0 refills | Status: DC

## 2023-11-22 MED ORDER — IPRATROPIUM BROMIDE 0.06 % NA SOLN: 2.0000 | Freq: Four times a day (QID) | NASAL | 0 refills | Status: DC

## 2023-11-22 MED ORDER — PROMETHAZINE-DM 6.25-15 MG/5ML PO SYRP: 5.0000 mL | ORAL_SOLUTION | Freq: Four times a day (QID) | ORAL | 0 refills | Status: DC | PRN

## 2023-11-22 NOTE — Discharge Instructions (Addendum)
Your testing today was negative for COVID or influenza.  I do believe of a respiratory virus which is causing your symptoms.  Please use over-the-counter Tylenol according to the package instructions as needed for any fever or pain.  Use the Atrovent nasal spray, 2 squirts in each nostril every 6 hours, as needed for runny nose and postnasal drip.  Use the Tessalon Perles every 8 hours during the day.  Take them with a small sip of water.  They may give you some numbness to the base of your tongue or a metallic taste in your mouth, this is normal.  Use the Promethazine DM cough syrup at bedtime for cough and congestion.  It will make you drowsy so do not take it during the day.  Return for reevaluation or see your primary care provider for any new or worsening symptoms.

## 2023-11-22 NOTE — ED Provider Notes (Signed)
[facility]    CSN: [id number] Arrival date & time: 11/22/23  1421      History   Chief Complaint Chief Complaint  Patient presents with   sinus drainage    HPI Catherine Pena is a 77 y.o. female.   HPI  77 year old female with a past medical history significant for borderline diabetes, hypertension, and glaucoma presents for evaluation of respiratory symptoms which began yesterday.  She endorses nasal congestion, postnasal drip, scratchy throat, and an infrequent nonproductive cough.  She denies any fever or ear pain.  Past Medical History:  Diagnosis Date   Glaucoma    Hypertension     Patient Active Problem List   Diagnosis Date Noted   Benign essential HTN 04/14/2015   Adiposity 04/14/2015   Borderline diabetes 04/14/2015    Past Surgical History:  Procedure Laterality Date   CATARACT EXTRACTION, BILATERAL Bilateral 10/2016 and 11/2016   EYE SURGERY      OB History   No obstetric history on file.      Home Medications    Prior to Admission medications   Medication Sig Start Date End Date Taking? Authorizing Provider  benzonatate (TESSALON) 100 MG capsule Take 2 capsules (200 mg total) by mouth every 8 (eight) hours. 11/22/23  Yes Becky Augusta, NP  lisinopril (ZESTRIL) 10 MG tablet Take 1 tablet (10 mg total) by mouth daily. 07/15/23  Yes Duanne Limerick, MD  albuterol (VENTOLIN HFA) 108 (90 Base) MCG/ACT inhaler Inhale 2 puffs into the lungs every 4 (four) hours as needed for wheezing or shortness of breath. 01/01/23   Domenick Gong, MD  ibuprofen (ADVIL) 600 MG tablet Take 1 tablet (600 mg total) by mouth every 6 (six) hours as needed. 07/15/23   Duanne Limerick, MD  ipratropium (ATROVENT) 0.06 % nasal spray Place 2 sprays into both nostrils 4 (four) times daily. 11/22/23   Becky Augusta, NP  latanoprost (XALATAN) 0.005 % ophthalmic solution Place 1 drop into both eyes at bedtime. 09/30/17   [provider]  loratadine (CLARITIN) 10  MG tablet Take 1 tablet (10 mg total) by mouth daily. 03/10/23   Katha Cabal, DO  promethazine-dextromethorphan (PROMETHAZINE-DM) 6.25-15 MG/5ML syrup Take 5 mLs by mouth 4 (four) times daily as needed. 11/22/23   Becky Augusta, NP  Spacer/Aero-Holding Deretha Emory (AEROCHAMBER MV) inhaler Use as instructed 01/01/23   Domenick Gong, MD  timolol (BETIMOL) 0.5 % ophthalmic solution Place 1 drop into the right eye daily.    [provider]  timolol (TIMOPTIC) 0.25 % ophthalmic solution 1 drop every morning. 06/21/23   [provider]    Family History Family History  Problem Relation Age of Onset   Ovarian cancer Mother    Diabetes Father     Social History Social History   Tobacco Use   Smoking status: Former    Current packs/day: 0.25    Average packs/day: 0.3 packs/day for 20.0 years (5.0 ttl pk-yrs)    Types: Cigarettes   Smokeless tobacco: Never  Vaping Use   Vaping status: Never Used  Substance Use Topics   Alcohol use: No   Drug use: No     Allergies   Patient has no known allergies.   Review of Systems Review of Systems  Constitutional:  Negative for fever.  HENT:  Positive for congestion, postnasal drip and sore throat. Negative for ear pain.   Respiratory:  Positive for cough. Negative for shortness of breath and wheezing.  Physical Exam Triage Vital Signs ED Triage Vitals  Encounter Vitals Group     BP      Systolic BP Percentile      Diastolic BP Percentile      Pulse      Resp      Temp      Temp src      SpO2      Weight      Height      Head Circumference      Peak Flow      Pain Score      Pain Loc      Pain Education      Exclude from Growth Chart    No data found.  Updated Vital Signs BP (!) 170/94 (BP Location: Left Arm)   Pulse 68   Temp 98.6 F (37 C) (Oral)   Resp 17   SpO2 98%   Visual Acuity Right Eye Distance:   Left Eye Distance:   Bilateral Distance:    Right Eye Near:   Left Eye Near:     Bilateral Near:     Physical Exam Vitals and nursing note reviewed.  Constitutional:      Appearance: Normal appearance. She is not ill-appearing.  HENT:     Head: Normocephalic and atraumatic.     Right Ear: Tympanic membrane, ear canal and external ear normal. There is no impacted cerumen.     Left Ear: Tympanic membrane, ear canal and external ear normal. There is no impacted cerumen.     Nose: Congestion and rhinorrhea present.     Comments: This mucosa is mildly edematous without erythema.  Clear rhinorrhea present on exam.    Mouth/Throat:     Mouth: Mucous membranes are moist.     Pharynx: Oropharynx is clear. Posterior oropharyngeal erythema present. No oropharyngeal exudate.     Comments: Mild erythema to the posterior oropharynx with clear postnasal drip. Cardiovascular:     Rate and Rhythm: Normal rate and regular rhythm.     Pulses: Normal pulses.     Heart sounds: Normal heart sounds. No murmur heard.    No friction rub. No gallop.  Pulmonary:     Effort: Pulmonary effort is normal.     Breath sounds: Normal breath sounds. No wheezing, rhonchi or rales.  Musculoskeletal:     Cervical back: Normal range of motion and neck supple. No tenderness.  Lymphadenopathy:     Cervical: No cervical adenopathy.  Skin:    General: Skin is warm and dry.     Capillary Refill: Capillary refill takes less than 2 seconds.     Findings: No rash.  Neurological:     General: No focal deficit present.     Mental Status: She is alert and oriented to person, place, and time.      UC Treatments / Results  Labs (all labs ordered are listed, but only abnormal results are displayed) Labs Reviewed  RESP PANEL BY RT-PCR (FLU A&B, COVID) [id number]    EKG   Radiology No results found.  Procedures Procedures (including critical care time)  Medications Ordered in UC Medications - No data to display  Initial Impression / Assessment and Plan / UC Course  I have reviewed the triage  vital signs and the nursing notes.  Pertinent labs & imaging results that were available during my care of the patient were reviewed by me and considered in my medical decision making (see chart for details).   Patient  is a nontoxic-appearing 77 year old female presenting for evaluation of 1 day worth of respiratory symptoms as outlined HPI above.  She does have inflamed nasal mucosa with clear rhinorrhea and clear postnasal drip.  Cardiopulmonary exam is benign.  I will order a COVID and influenza PCR panel to evaluate for the presence of either 1.  If those are negative I will treat her for viral URI with cough.  Respiratory panel is negative for COVID or influenza.  I will discharge patient on the diagnosis of viral URI with cough with a prescription for Atrovent nasal spray to open the nasal congestion postnasal drip, Tessalon Perles and Promethazine DM cough syrup for cough and congestion.  Return precautions reviewed.   Final Clinical Impressions(s) / UC Diagnoses   Final diagnoses:  Viral URI with cough     Discharge Instructions      Your testing today was negative for COVID or influenza.  I do believe of a respiratory virus which is causing your symptoms.  Please use over-the-counter Tylenol according to the package instructions as needed for any fever or pain.  Use the Atrovent nasal spray, 2 squirts in each nostril every 6 hours, as needed for runny nose and postnasal drip.  Use the Tessalon Perles every 8 hours during the day.  Take them with a small sip of water.  They may give you some numbness to the base of your tongue or a metallic taste in your mouth, this is normal.  Use the Promethazine DM cough syrup at bedtime for cough and congestion.  It will make you drowsy so do not take it during the day.  Return for reevaluation or see your primary care provider for any new or worsening symptoms.      ED Prescriptions     Medication Sig Dispense Auth. Provider    ipratropium (ATROVENT) 0.06 % nasal spray Place 2 sprays into both nostrils 4 (four) times daily. 15 mL Becky Augusta, NP   promethazine-dextromethorphan (PROMETHAZINE-DM) 6.25-15 MG/5ML syrup Take 5 mLs by mouth 4 (four) times daily as needed. 118 mL Becky Augusta, NP   benzonatate (TESSALON) 100 MG capsule Take 2 capsules (200 mg total) by mouth every 8 (eight) hours. 21 capsule Becky Augusta, NP      PDMP not reviewed this encounter.   Becky Augusta, NP 11/22/23 1614

## 2023-11-22 NOTE — ED Triage Notes (Signed)
Sinus drainage that started yesterday. No other sx.

## 2024-01-23 DIAGNOSIS — Z961 Presence of intraocular lens: Secondary | ICD-10-CM | POA: Diagnosis not present

## 2024-01-23 DIAGNOSIS — H43812 Vitreous degeneration, left eye: Secondary | ICD-10-CM | POA: Diagnosis not present

## 2024-01-23 DIAGNOSIS — H401131 Primary open-angle glaucoma, bilateral, mild stage: Secondary | ICD-10-CM | POA: Diagnosis not present

## 2024-01-28 ENCOUNTER — Encounter: Payer: Self-pay | Admitting: Family Medicine

## 2024-01-28 ENCOUNTER — Ambulatory Visit: Payer: BC Managed Care – PPO | Admitting: Family Medicine

## 2024-01-28 DIAGNOSIS — I10 Essential (primary) hypertension: Secondary | ICD-10-CM | POA: Diagnosis not present

## 2024-01-28 DIAGNOSIS — M170 Bilateral primary osteoarthritis of knee: Secondary | ICD-10-CM | POA: Diagnosis not present

## 2024-01-28 MED ORDER — HYDROCHLOROTHIAZIDE 12.5 MG PO TABS: 12.5000 mg | ORAL_TABLET | Freq: Every day | ORAL | 0 refills | Status: DC

## 2024-01-28 MED ORDER — LISINOPRIL 10 MG PO TABS: 10.0000 mg | ORAL_TABLET | Freq: Every day | ORAL | 0 refills | Status: DC

## 2024-01-28 NOTE — Progress Notes (Signed)
 Date:  01/28/2024   Name:  Catherine Pena   DOB:  09-04-1946   MRN:  [id number]   Chief Complaint: Hypertension  Hypertension This is a chronic problem. The problem has been gradually improving since onset. The problem is controlled. Pertinent negatives include no anxiety, blurred vision, malaise/fatigue, orthopnea, peripheral edema, PND or sweats. There are no associated agents to hypertension. Risk factors for coronary artery disease include dyslipidemia. Past treatments include ACE inhibitors and diuretics. The current treatment provides moderate improvement. There is no history of CAD/MI or CVA. There is no history of chronic renal disease, a hypertension causing med or renovascular disease.    Lab Results  Component Value Date   NA 142 07/15/2023   K 4.3 07/15/2023   CO2 21 07/15/2023   GLUCOSE 113 (H) 07/15/2023   BUN 13 07/15/2023   CREATININE 0.84 07/15/2023   CALCIUM 9.7 07/15/2023   EGFR 72 07/15/2023   GFRNONAA 83 01/06/2021   Lab Results  Component Value Date   CHOL 191 01/14/2023   HDL 45 01/14/2023   LDLCALC 121 (H) 01/14/2023   TRIG 138 01/14/2023   CHOLHDL 3.2 10/24/2017   No results found for: TSH No results found for: HGBA1C Lab Results  Component Value Date   WBC 5.1 08/06/2016   HGB 13.2 08/06/2016   HCT 37.3 08/06/2016   MCV 82.7 08/06/2016   PLT 137 (L) 08/06/2016   Lab Results  Component Value Date   ALT 17 01/14/2023   AST 17 01/14/2023   ALKPHOS 65 01/14/2023   BILITOT 0.4 01/14/2023   No results found for: 25OHVITD2, 25OHVITD3, VD25OH   Review of Systems  Constitutional:  Negative for malaise/fatigue and unexpected weight change.  HENT:  Negative for trouble swallowing.   Eyes:  Negative for blurred vision and visual disturbance.  Respiratory:  Negative for cough, chest tightness, wheezing and stridor.   Cardiovascular:  Negative for orthopnea and PND.  Gastrointestinal:  Negative for blood in stool.  Endocrine: Negative  for polydipsia and polyuria.  Genitourinary:  Negative for frequency, hematuria and vaginal bleeding.    Patient Active Problem List   Diagnosis Date Noted   Benign essential HTN 04/14/2015   Adiposity 04/14/2015   Borderline diabetes 04/14/2015    No Known Allergies  Past Surgical History:  Procedure Laterality Date   CATARACT EXTRACTION, BILATERAL Bilateral 10/2016 and 11/2016   EYE SURGERY      Social History   Tobacco Use   Smoking status: Former    Current packs/day: 0.25    Average packs/day: 0.3 packs/day for 20.0 years (5.0 ttl pk-yrs)    Types: Cigarettes   Smokeless tobacco: Never  Vaping Use   Vaping status: Never Used  Substance Use Topics   Alcohol use: No   Drug use: No     Medication list has been reviewed and updated.  Current Meds  Medication Sig   albuterol  (VENTOLIN  HFA) 108 (90 Base) MCG/ACT inhaler Inhale 2 puffs into the lungs every 4 (four) hours as needed for wheezing or shortness of breath.   ibuprofen  (ADVIL ) 600 MG tablet Take 1 tablet (600 mg total) by mouth every 6 (six) hours as needed.   ipratropium (ATROVENT ) 0.06 % nasal spray Place 2 sprays into both nostrils 4 (four) times daily.   latanoprost (XALATAN) 0.005 % ophthalmic solution Place 1 drop into both eyes at bedtime.   lisinopril  (ZESTRIL ) 10 MG tablet Take 1 tablet (10 mg total) by mouth daily.  loratadine  (CLARITIN ) 10 MG tablet Take 1 tablet (10 mg total) by mouth daily.   timolol (TIMOPTIC) 0.25 % ophthalmic solution 1 drop every morning.       01/28/2024    3:30 PM 08/08/2023    4:33 PM 07/15/2023    2:01 PM 01/14/2023    3:49 PM  GAD 7 : Generalized Anxiety Score  Nervous, Anxious, on Edge 0 0 0 0  Control/stop worrying 0 0 0 0  Worry too much - different things 0 0 0 0  Trouble relaxing 0 0 0 0  Restless 0 0 0 0  Easily annoyed or irritable 0 0 0 0  Afraid - awful might happen 0 0 0 0  Total GAD 7 Score 0 0 0 0  Anxiety Difficulty Not difficult at all Not  difficult at all Not difficult at all Not difficult at all       01/28/2024    3:29 PM 08/08/2023    4:33 PM 07/15/2023    2:00 PM  Depression screen PHQ 2/9  Decreased Interest 0 0 0  Down, Depressed, Hopeless 0 0 0  PHQ - 2 Score 0 0 0  Altered sleeping 0 0 0  Tired, decreased energy 0 0 0  Change in appetite 0 0 0  Feeling bad or failure about yourself  0 0 0  Trouble concentrating 0 0 0  Moving slowly or fidgety/restless 0 0 0  Suicidal thoughts 0 0 0  PHQ-9 Score 0 0 0  Difficult doing work/chores Not difficult at all Not difficult at all Not difficult at all    BP Readings from Last 3 Encounters:  01/28/24 (!) 140/72  11/22/23 (!) 170/94  08/08/23 128/82    Physical Exam Vitals and nursing note reviewed.  Constitutional:      General: She is not in acute distress.    Appearance: She is not diaphoretic.  HENT:     Head: Normocephalic and atraumatic.     Right Ear: Tympanic membrane and external ear normal.     Left Ear: Tympanic membrane and external ear normal.     Nose: Nose normal.     Mouth/Throat:     Mouth: Mucous membranes are moist.  Eyes:     General:        Right eye: No discharge.        Left eye: No discharge.     Conjunctiva/sclera: Conjunctivae normal.     Pupils: Pupils are equal, round, and reactive to light.  Neck:     Thyroid: No thyromegaly.     Vascular: No JVD.  Cardiovascular:     Rate and Rhythm: Normal rate and regular rhythm.     Heart sounds: Normal heart sounds. No murmur heard.    No friction rub. No gallop.  Pulmonary:     Effort: Pulmonary effort is normal.     Breath sounds: Normal breath sounds. No wheezing, rhonchi or rales.  Abdominal:     General: Bowel sounds are normal.     Palpations: Abdomen is soft. There is no mass.     Tenderness: There is no abdominal tenderness. There is no guarding or rebound.  Musculoskeletal:        General: Normal range of motion.     Cervical back: Normal range of motion and neck supple.   Lymphadenopathy:     Cervical: No cervical adenopathy.  Skin:    General: Skin is warm and dry.  Neurological:     Mental  Status: She is alert.     Deep Tendon Reflexes: Reflexes are normal and symmetric.     Wt Readings from Last 3 Encounters:  01/28/24 197 lb (89.4 kg)  08/08/23 194 lb (88 kg)  07/15/23 193 lb (87.5 kg)    BP (!) 140/72   Pulse 74   Ht 5' 4 (1.626 m)   Wt 197 lb (89.4 kg)   SpO2 98%   BMI 33.81 kg/m   Assessment and Plan: 1. Benign essential HTN Chronic.  Controlled.  Stable.  On second reading blood pressure is 150/100.  Asymptomatic.  Tolerating medication lisinopril  10 mg well.  Blood pressure is probably elevated because she is just taking the medication about an hour ago and rushed and but I think is going to still be high regardless and we will going to add hydrochlorothiazide  12.5 mg once a day to this regimen and will recheck in 6 weeks. - lisinopril  (ZESTRIL ) 10 MG tablet; Take 1 tablet (10 mg total) by mouth daily.  Dispense: 90 tablet; Refill: 0 - hydrochlorothiazide  (HYDRODIURIL ) 12.5 MG tablet; Take 1 tablet (12.5 mg total) by mouth daily.  Dispense: 90 tablet; Refill: 0  2. Primary osteoarthritis of both knees Patient originally wanted ibuprofen  600 mg but I discussed that this will make her blood pressure go up and then it would be better for her kidneys as well is not affecting her blood pressure if she was to take Tylenol instead and she would be okay to do this and that she just takes this on an as-needed basis but she did take some ibuprofen  prior to arriving for her blood pressure reading today.    Cathryne Molt, MD

## 2024-02-28 ENCOUNTER — Encounter: Payer: Self-pay | Admitting: Emergency Medicine

## 2024-02-28 ENCOUNTER — Ambulatory Visit
Admission: EM | Admit: 2024-02-28 | Discharge: 2024-02-28 | Disposition: A | Discharge status: Home / Self Care | Attending: Emergency Medicine | Admitting: Emergency Medicine

## 2024-02-28 DIAGNOSIS — J069 Acute upper respiratory infection, unspecified: Secondary | ICD-10-CM | POA: Insufficient documentation

## 2024-02-28 LAB — RESP PANEL BY RT-PCR (RSV, FLU A&B, COVID)  RVPGX2
Influenza A by PCR: NEGATIVE
Influenza B by PCR: NEGATIVE
Resp Syncytial Virus by PCR: NEGATIVE
SARS Coronavirus 2 by RT PCR: NEGATIVE

## 2024-02-28 MED ORDER — BENZONATATE 100 MG PO CAPS: 200.0000 mg | ORAL_CAPSULE | Freq: Three times a day (TID) | ORAL | 0 refills | Status: DC

## 2024-02-28 MED ORDER — IPRATROPIUM BROMIDE 0.06 % NA SOLN: 2.0000 | Freq: Four times a day (QID) | NASAL | 12 refills | Status: AC

## 2024-02-28 MED ORDER — PROMETHAZINE-DM 6.25-15 MG/5ML PO SYRP: 5.0000 mL | ORAL_SOLUTION | Freq: Four times a day (QID) | ORAL | 0 refills | Status: DC | PRN

## 2024-02-28 NOTE — ED Provider Notes (Signed)
 [facility]    CSN: [id number] Arrival date & time: 02/28/24  1727      History   Chief Complaint Chief Complaint  Patient presents with   Nasal Congestion    HPI Catherine Pena is a 78 y.o. female.   HPI  78 year old female with past medical history significant for borderline diabetes, essential hypertension, glaucoma presents for evaluation of runny nose nasal congestion, and watery eye drainage that started yesterday.  She also is a slight, infrequent, nonproductive cough.  She denies fever, ear pain, sore throat, shortness breath, wheezing, GI complaints, headache, or bodyaches.  No known sick contacts.  Past Medical History:  Diagnosis Date   Glaucoma    Hypertension     Patient Active Problem List   Diagnosis Date Noted   Benign essential HTN 04/14/2015   Adiposity 04/14/2015   Borderline diabetes 04/14/2015    Past Surgical History:  Procedure Laterality Date   CATARACT EXTRACTION, BILATERAL Bilateral 10/2016 and 11/2016   EYE SURGERY      OB History   No obstetric history on file.      Home Medications    Prior to Admission medications   Medication Sig Start Date End Date Taking? Authorizing Provider  albuterol (VENTOLIN HFA) 108 (90 Base) MCG/ACT inhaler Inhale 2 puffs into the lungs every 4 (four) hours as needed for wheezing or shortness of breath. 01/01/23  Yes Domenick Gong, MD  benzonatate (TESSALON) 100 MG capsule Take 2 capsules (200 mg total) by mouth every 8 (eight) hours. 02/28/24  Yes Becky Augusta, NP  hydrochlorothiazide (HYDRODIURIL) 12.5 MG tablet Take 1 tablet (12.5 mg total) by mouth daily. 01/28/24  Yes Duanne Limerick, MD  ibuprofen (ADVIL) 600 MG tablet Take 1 tablet (600 mg total) by mouth every 6 (six) hours as needed. 07/15/23  Yes Duanne Limerick, MD  ipratropium (ATROVENT) 0.06 % nasal spray Place 2 sprays into both nostrils 4 (four) times daily. 02/28/24  Yes Becky Augusta, NP  latanoprost (XALATAN) 0.005 % ophthalmic  solution Place 1 drop into both eyes at bedtime. 09/30/17  Yes [provider]  lisinopril (ZESTRIL) 10 MG tablet Take 1 tablet (10 mg total) by mouth daily. 01/28/24  Yes Duanne Limerick, MD  loratadine (CLARITIN) 10 MG tablet Take 1 tablet (10 mg total) by mouth daily. 03/10/23  Yes Brimage, Vondra, DO  promethazine-dextromethorphan (PROMETHAZINE-DM) 6.25-15 MG/5ML syrup Take 5 mLs by mouth 4 (four) times daily as needed. 02/28/24  Yes Becky Augusta, NP  timolol (TIMOPTIC) 0.25 % ophthalmic solution 1 drop every morning. 06/21/23  Yes [provider]    Family History Family History  Problem Relation Age of Onset   Ovarian cancer Mother    Diabetes Father     Social History Social History   Tobacco Use   Smoking status: Former    Current packs/day: 0.25    Average packs/day: 0.3 packs/day for 20.0 years (5.0 ttl pk-yrs)    Types: Cigarettes   Smokeless tobacco: Never  Vaping Use   Vaping status: Never Used  Substance Use Topics   Alcohol use: No   Drug use: No     Allergies   Patient has no known allergies.   Review of Systems Review of Systems  Constitutional:  Negative for fever.  HENT:  Positive for congestion and rhinorrhea. Negative for ear pain and sore throat.   Eyes:  Positive for discharge. Negative for redness and itching.       Watery eye  discharge  Respiratory:  Positive for cough. Negative for shortness of breath and wheezing.   Gastrointestinal:  Negative for diarrhea, nausea and vomiting.  Musculoskeletal:  Negative for arthralgias and myalgias.  Skin:  Negative for rash.  Neurological:  Negative for headaches.     Physical Exam Triage Vital Signs ED Triage Vitals  Encounter Vitals Group     BP      Systolic BP Percentile      Diastolic BP Percentile      Pulse      Resp      Temp      Temp src      SpO2      Weight      Height      Head Circumference      Peak Flow      Pain Score      Pain Loc      Pain Education       Exclude from Growth Chart    No data found.  Updated Vital Signs BP (!) 169/105 (BP Location: Left Arm)   Pulse 68   Temp 98.6 F (37 C) (Oral)   Ht 5\' 4"  (1.626 m)   Wt 190 lb (86.2 kg)   SpO2 97%   BMI 32.61 kg/m   Visual Acuity Right Eye Distance:   Left Eye Distance:   Bilateral Distance:    Right Eye Near:   Left Eye Near:    Bilateral Near:     Physical Exam Vitals and nursing note reviewed.  Constitutional:      Appearance: Normal appearance. She is not ill-appearing.  HENT:     Head: Normocephalic and atraumatic.     Right Ear: Tympanic membrane, ear canal and external ear normal. There is no impacted cerumen.     Left Ear: Tympanic membrane, ear canal and external ear normal. There is no impacted cerumen.     Nose: Congestion and rhinorrhea present.     Comments: Nasal mucosa is erythematous and edematous with clear discharge in both nares.    Mouth/Throat:     Mouth: Mucous membranes are moist.     Pharynx: Oropharynx is clear. No oropharyngeal exudate or posterior oropharyngeal erythema.  Eyes:     General: No scleral icterus.       Right eye: No discharge.        Left eye: No discharge.     Extraocular Movements: Extraocular movements intact.     Conjunctiva/sclera: Conjunctivae normal.     Pupils: Pupils are equal, round, and reactive to light.  Cardiovascular:     Rate and Rhythm: Normal rate and regular rhythm.     Pulses: Normal pulses.     Heart sounds: Normal heart sounds. No murmur heard.    No friction rub. No gallop.  Pulmonary:     Effort: Pulmonary effort is normal.     Breath sounds: Normal breath sounds. No wheezing, rhonchi or rales.  Musculoskeletal:     Cervical back: Normal range of motion and neck supple. No tenderness.  Lymphadenopathy:     Cervical: No cervical adenopathy.  Skin:    General: Skin is warm and dry.     Capillary Refill: Capillary refill takes less than 2 seconds.     Findings: No rash.  Neurological:      General: No focal deficit present.     Mental Status: She is alert and oriented to person, place, and time.      UC Treatments /  Results  Labs (all labs ordered are listed, but only abnormal results are displayed) Labs Reviewed  RESP PANEL BY RT-PCR (RSV, FLU A&B, COVID)  [id number]    EKG   Radiology No results found.  Procedures Procedures (including critical care time)  Medications Ordered in UC Medications - No data to display  Initial Impression / Assessment and Plan / UC Course  I have reviewed the triage vital signs and the nursing notes.  Pertinent labs & imaging results that were available during my care of the patient were reviewed by me and considered in my medical decision making (see chart for details).   Patient is a nontoxic-appearing 78 year old female presenting for evaluation of URI symptoms that started yesterday as outlined HPI above.  Her physical exam is only significant for plain nasal mucosa with clear rhinorrhea.  Oropharyngeal exam is benign.  No cervical lymphadenopathy appreciated exam.  Cardiopulmonary exam reveals: Sounds in all fields.  She also has no watery discharge from either eye and her bulbar labral conjunctiva are unremarkable.  Differential diagnose include COVID, influenza, RSV, viral respiratory illness.  I will order a PCR to evaluate for the presence of COVID, influenza, and RSV.  Respiratory panel is negative for COVID, influenza, and RSV.  I will discharge patient home with a diagnosis of viral URI with a cough with a prescription for Atrovent nasal spray to help with nasal congestion.  Tessalon Perles and Promethazine DM cough syrup for cough and congestion.  Return precautions reviewed.   Final Clinical Impressions(s) / UC Diagnoses   Final diagnoses:  Viral URI with cough     Discharge Instructions      Your testing today was negative for COVID, influenza, and RSV.  However, based upon your physical exam, I do feel that you  are developing a viral respiratory infection.  Please use over-the-counter Tylenol and/or ibuprofen according the package instructions as needed for any fever or pain.  Use the Atrovent nasal spray, 2 squirts in each nostril every 6 hours, as needed for runny nose and postnasal drip.  Use the Tessalon Perles every 8 hours during the day.  Take them with a small sip of water.  They may give you some numbness to the base of your tongue or a metallic taste in your mouth, this is normal.  Use the Promethazine DM cough syrup at bedtime for cough and congestion.  It will make you drowsy so do not take it during the day.  Return for reevaluation or see your primary care provider for any new or worsening symptoms.      ED Prescriptions     Medication Sig Dispense Auth. Provider   benzonatate (TESSALON) 100 MG capsule Take 2 capsules (200 mg total) by mouth every 8 (eight) hours. 21 capsule Becky Augusta, NP   ipratropium (ATROVENT) 0.06 % nasal spray Place 2 sprays into both nostrils 4 (four) times daily. 15 mL Becky Augusta, NP   promethazine-dextromethorphan (PROMETHAZINE-DM) 6.25-15 MG/5ML syrup Take 5 mLs by mouth 4 (four) times daily as needed. 118 mL Becky Augusta, NP      PDMP not reviewed this encounter.   Becky Augusta, NP 02/28/24 [phone]

## 2024-02-28 NOTE — Discharge Instructions (Addendum)
 Your testing today was negative for COVID, influenza, and RSV.  However, based upon your physical exam, I do feel that you are developing a viral respiratory infection.  Please use over-the-counter Tylenol and/or ibuprofen according the package instructions as needed for any fever or pain.  Use the Atrovent nasal spray, 2 squirts in each nostril every 6 hours, as needed for runny nose and postnasal drip.  Use the Tessalon Perles every 8 hours during the day.  Take them with a small sip of water.  They may give you some numbness to the base of your tongue or a metallic taste in your mouth, this is normal.  Use the Promethazine DM cough syrup at bedtime for cough and congestion.  It will make you drowsy so do not take it during the day.  Return for reevaluation or see your primary care provider for any new or worsening symptoms.

## 2024-02-28 NOTE — ED Triage Notes (Signed)
 Pt c/o nasal congestion, nasal drainage, eye drainage x1day

## 2024-03-16 ENCOUNTER — Encounter: Payer: Self-pay | Admitting: Family Medicine

## 2024-03-16 ENCOUNTER — Ambulatory Visit: Payer: BC Managed Care – PPO | Admitting: Family Medicine

## 2024-03-16 VITALS — BP 128/82 | HR 63 | Ht 64.0 in | Wt 194.0 lb

## 2024-03-16 DIAGNOSIS — I10 Essential (primary) hypertension: Secondary | ICD-10-CM

## 2024-03-16 DIAGNOSIS — E782 Mixed hyperlipidemia: Secondary | ICD-10-CM | POA: Diagnosis not present

## 2024-03-16 MED ORDER — HYDROCHLOROTHIAZIDE 12.5 MG PO TABS: 12.5000 mg | ORAL_TABLET | Freq: Every day | ORAL | 1 refills | Status: AC

## 2024-03-16 MED ORDER — LISINOPRIL 10 MG PO TABS: 10.0000 mg | ORAL_TABLET | Freq: Every day | ORAL | 1 refills | Status: AC

## 2024-03-16 NOTE — Progress Notes (Signed)
 Date:  03/16/2024   Name:  Catherine Pena   DOB:  1946/05/23   MRN:  [id number]   Chief Complaint: Hypertension  Hypertension This is a chronic problem. The current episode started more than 1 year ago. The problem has been gradually improving since onset. The problem is controlled. Pertinent negatives include no anxiety, blurred vision, chest pain, headaches, malaise/fatigue, neck pain, orthopnea, palpitations, peripheral edema, PND, shortness of breath or sweats. There are no associated agents to hypertension. There are no known risk factors for coronary artery disease. Past treatments include ACE inhibitors and diuretics. The current treatment provides moderate improvement. There are no compliance problems.  There is no history of angina, kidney disease, CAD/MI or CVA. There is no history of chronic renal disease, a hypertension causing med or renovascular disease.    Lab Results  Component Value Date   NA 142 07/15/2023   K 4.3 07/15/2023   CO2 21 07/15/2023   GLUCOSE 113 (H) 07/15/2023   BUN 13 07/15/2023   CREATININE 0.84 07/15/2023   CALCIUM 9.7 07/15/2023   EGFR 72 07/15/2023   GFRNONAA 83 01/06/2021   Lab Results  Component Value Date   CHOL 191 01/14/2023   HDL 45 01/14/2023   LDLCALC 121 (H) 01/14/2023   TRIG 138 01/14/2023   CHOLHDL 3.2 10/24/2017   No results found for: "TSH" No results found for: "HGBA1C" Lab Results  Component Value Date   WBC 5.1 08/06/2016   HGB 13.2 08/06/2016   HCT 37.3 08/06/2016   MCV 82.7 08/06/2016   PLT 137 (L) 08/06/2016   Lab Results  Component Value Date   ALT 17 01/14/2023   AST 17 01/14/2023   ALKPHOS 65 01/14/2023   BILITOT 0.4 01/14/2023   No results found for: "25OHVITD2", "25OHVITD3", "VD25OH"   Review of Systems  Constitutional: Negative.  Negative for chills, fatigue, fever, malaise/fatigue and unexpected weight change.  HENT:  Negative for congestion, ear discharge, ear pain, rhinorrhea, sinus pressure,  sneezing and sore throat.   Eyes:  Negative for blurred vision.  Respiratory:  Negative for cough, shortness of breath, wheezing and stridor.   Cardiovascular:  Negative for chest pain, palpitations, orthopnea and PND.  Gastrointestinal:  Negative for abdominal pain, blood in stool, constipation, diarrhea and nausea.  Genitourinary:  Negative for dysuria, flank pain, frequency, hematuria, urgency and vaginal discharge.  Musculoskeletal:  Negative for arthralgias, back pain, myalgias and neck pain.  Skin:  Negative for rash.  Neurological:  Negative for dizziness, weakness and headaches.  Hematological:  Negative for adenopathy. Does not bruise/bleed easily.  Psychiatric/Behavioral:  Negative for dysphoric mood. The patient is not nervous/anxious.     Patient Active Problem List   Diagnosis Date Noted   Benign essential HTN 04/14/2015   Adiposity 04/14/2015   Borderline diabetes 04/14/2015    No Known Allergies  Past Surgical History:  Procedure Laterality Date   CATARACT EXTRACTION, BILATERAL Bilateral 10/2016 and 11/2016   EYE SURGERY      Social History   Tobacco Use   Smoking status: Former    Current packs/day: 0.25    Average packs/day: 0.3 packs/day for 20.0 years (5.0 ttl pk-yrs)    Types: Cigarettes   Smokeless tobacco: Never  Vaping Use   Vaping status: Never Used  Substance Use Topics   Alcohol use: No   Drug use: No     Medication list has been reviewed and updated.  Current Meds  Medication Sig   albuterol (VENTOLIN  HFA) 108 (90 Base) MCG/ACT inhaler Inhale 2 puffs into the lungs every 4 (four) hours as needed for wheezing or shortness of breath.   ibuprofen (ADVIL) 600 MG tablet Take 1 tablet (600 mg total) by mouth every 6 (six) hours as needed.   ipratropium (ATROVENT) 0.06 % nasal spray Place 2 sprays into both nostrils 4 (four) times daily.   latanoprost (XALATAN) 0.005 % ophthalmic solution Place 1 drop into both eyes at bedtime.   loratadine  (CLARITIN) 10 MG tablet Take 1 tablet (10 mg total) by mouth daily.   timolol (TIMOPTIC) 0.25 % ophthalmic solution 1 drop every morning.   [DISCONTINUED] benzonatate (TESSALON) 100 MG capsule Take 2 capsules (200 mg total) by mouth every 8 (eight) hours.   [DISCONTINUED] hydrochlorothiazide (HYDRODIURIL) 12.5 MG tablet Take 1 tablet (12.5 mg total) by mouth daily.   [DISCONTINUED] lisinopril (ZESTRIL) 10 MG tablet Take 1 tablet (10 mg total) by mouth daily.   [DISCONTINUED] promethazine-dextromethorphan (PROMETHAZINE-DM) 6.25-15 MG/5ML syrup Take 5 mLs by mouth 4 (four) times daily as needed.       01/28/2024    3:30 PM 08/08/2023    4:33 PM 07/15/2023    2:01 PM 01/14/2023    3:49 PM  GAD 7 : Generalized Anxiety Score  Nervous, Anxious, on Edge 0 0 0 0  Control/stop worrying 0 0 0 0  Worry too much - different things 0 0 0 0  Trouble relaxing 0 0 0 0  Restless 0 0 0 0  Easily annoyed or irritable 0 0 0 0  Afraid - awful might happen 0 0 0 0  Total GAD 7 Score 0 0 0 0  Anxiety Difficulty Not difficult at all Not difficult at all Not difficult at all Not difficult at all       01/28/2024    3:29 PM 08/08/2023    4:33 PM 07/15/2023    2:00 PM  Depression screen PHQ 2/9  Decreased Interest 0 0 0  Down, Depressed, Hopeless 0 0 0  PHQ - 2 Score 0 0 0  Altered sleeping 0 0 0  Tired, decreased energy 0 0 0  Change in appetite 0 0 0  Feeling bad or failure about yourself  0 0 0  Trouble concentrating 0 0 0  Moving slowly or fidgety/restless 0 0 0  Suicidal thoughts 0 0 0  PHQ-9 Score 0 0 0  Difficult doing work/chores Not difficult at all Not difficult at all Not difficult at all    BP Readings from Last 3 Encounters:  03/16/24 (!) 148/88  02/28/24 (!) 169/105  01/28/24 (!) 150/100    Physical Exam Vitals and nursing note reviewed.  Constitutional:      General: She is not in acute distress.    Appearance: She is not diaphoretic.  HENT:     Head: Normocephalic and  atraumatic.     Right Ear: Tympanic membrane, ear canal and external ear normal.     Left Ear: Tympanic membrane, ear canal and external ear normal.     Nose: Nose normal.     Mouth/Throat:     Mouth: Mucous membranes are moist.     Pharynx: No oropharyngeal exudate or posterior oropharyngeal erythema.  Eyes:     General:        Right eye: No discharge.        Left eye: No discharge.     Conjunctiva/sclera: Conjunctivae normal.     Pupils: Pupils are equal, round, and reactive  to light.  Neck:     Thyroid: No thyromegaly.     Vascular: No JVD.  Cardiovascular:     Rate and Rhythm: Normal rate and regular rhythm.     Heart sounds: Normal heart sounds. No murmur heard.    No friction rub. No gallop.  Pulmonary:     Effort: Pulmonary effort is normal.     Breath sounds: Normal breath sounds. No wheezing, rhonchi or rales.  Abdominal:     General: Bowel sounds are normal.     Palpations: Abdomen is soft. There is no mass.     Tenderness: There is no abdominal tenderness. There is no guarding.  Musculoskeletal:        General: Normal range of motion.     Cervical back: Normal range of motion and neck supple.  Lymphadenopathy:     Cervical: No cervical adenopathy.  Skin:    General: Skin is warm and dry.  Neurological:     Mental Status: She is alert.     Deep Tendon Reflexes: Reflexes are normal and symmetric.     Wt Readings from Last 3 Encounters:  03/16/24 194 lb (88 kg)  02/28/24 190 lb (86.2 kg)  01/28/24 197 lb (89.4 kg)    BP (!) 148/88   Pulse 63   Ht 5\' 4"  (1.626 m)   Wt 194 lb (88 kg)   SpO2 98%   BMI 33.30 kg/m   Assessment and Plan: 1. Benign essential HTN (Primary)  - hydrochlorothiazide (HYDRODIURIL) 12.5 MG tablet; Take 1 tablet (12.5 mg total) by mouth daily.  Dispense: 90 tablet; Refill: 1 - lisinopril (ZESTRIL) 10 MG tablet; Take 1 tablet (10 mg total) by mouth daily.  Dispense: 90 tablet; Refill: 1 - Comprehensive metabolic panel Chronic.   Controlled.  Stable.  Blood pressure today is 128/82.  Asymptomatic.  Tolerating medications well.  Continue hydrochlorothiazide 12.5 mg once a day and lisinopril 10 mg once a day.  Will check CMP for electrolytes and GFR.  Will recheck patient in 6 months. 2. Moderate mixed hyperlipidemia not requiring statin therapy Chronic.  Controlled.  Stable.  Diet controlled and we will provide low-cholesterol low triglyceride dietary guidelines.  Will check lipid panel patient is fasting today and may need to be on a statin and we will decide upon review. - Lipid Panel With LDL/HDL Ratio     Elizabeth Sauer, MD

## 2024-03-16 NOTE — Patient Instructions (Signed)

## 2024-03-17 ENCOUNTER — Encounter: Payer: Self-pay | Admitting: Family Medicine

## 2024-03-17 LAB — LIPID PANEL WITH LDL/HDL RATIO
Cholesterol, Total: 164 mg/dL (ref 100–199)
HDL: 44 mg/dL (ref >39)
LDL Chol Calc (NIH): 105 mg/dL — ABNORMAL HIGH (ref 0–99)
LDL/HDL Ratio: 2.4 ratio (ref 0.0–3.2)
Triglycerides: 80 mg/dL (ref 0–149)
VLDL Cholesterol Cal: 15 mg/dL (ref 5–40)

## 2024-03-17 LAB — COMPREHENSIVE METABOLIC PANEL
ALT: 16 IU/L (ref 0–32)
AST: 18 IU/L (ref 0–40)
Albumin: 4.1 g/dL (ref 3.8–4.8)
Alkaline Phosphatase: 72 IU/L (ref 44–121)
BUN/Creatinine Ratio: 14 (ref 12–28)
BUN: 12 mg/dL (ref 8–27)
Bilirubin Total: 0.5 mg/dL (ref 0.0–1.2)
CO2: 25 mmol/L (ref 20–29)
Calcium: 9.3 mg/dL (ref 8.7–10.3)
Chloride: 102 mmol/L (ref 96–106)
Creatinine, Ser: 0.88 mg/dL (ref 0.57–1.00)
Globulin, Total: 2.7 g/dL (ref 1.5–4.5)
Glucose: 98 mg/dL (ref 70–99)
Potassium: 4.6 mmol/L (ref 3.5–5.2)
Sodium: 141 mmol/L (ref 134–144)
Total Protein: 6.8 g/dL (ref 6.0–8.5)
eGFR: 68 mL/min/{1.73_m2} (ref >59)

## 2024-05-28 DIAGNOSIS — Z961 Presence of intraocular lens: Secondary | ICD-10-CM | POA: Diagnosis not present

## 2024-05-28 DIAGNOSIS — H401131 Primary open-angle glaucoma, bilateral, mild stage: Secondary | ICD-10-CM | POA: Diagnosis not present

## 2024-05-28 DIAGNOSIS — H43812 Vitreous degeneration, left eye: Secondary | ICD-10-CM | POA: Diagnosis not present

## 2024-10-02 DIAGNOSIS — H524 Presbyopia: Secondary | ICD-10-CM | POA: Diagnosis not present

## 2024-10-02 DIAGNOSIS — Z961 Presence of intraocular lens: Secondary | ICD-10-CM | POA: Diagnosis not present

## 2024-10-02 DIAGNOSIS — H43812 Vitreous degeneration, left eye: Secondary | ICD-10-CM | POA: Diagnosis not present

## 2024-10-02 DIAGNOSIS — H401131 Primary open-angle glaucoma, bilateral, mild stage: Secondary | ICD-10-CM | POA: Diagnosis not present

## 2024-10-02 DIAGNOSIS — H5213 Myopia, bilateral: Secondary | ICD-10-CM | POA: Diagnosis not present

## 2024-10-02 DIAGNOSIS — H52223 Regular astigmatism, bilateral: Secondary | ICD-10-CM | POA: Diagnosis not present

## 2024-11-03 ENCOUNTER — Telehealth: Payer: Self-pay

## 2024-11-03 NOTE — Telephone Encounter (Signed)
 Spoke to patient needs TOC for refills.

## 2024-11-06 ENCOUNTER — Telehealth: Payer: Self-pay

## 2024-11-06 NOTE — Telephone Encounter (Signed)
 Left voice mail to set up transfer of care to refill medications.

## 2024-12-02 ENCOUNTER — Ambulatory Visit
Admission: EM | Admit: 2024-12-02 | Discharge: 2024-12-02 | Disposition: A | Discharge status: Home / Self Care | Attending: Physician Assistant | Admitting: Physician Assistant

## 2024-12-02 ENCOUNTER — Encounter: Payer: Self-pay | Admitting: Emergency Medicine

## 2024-12-02 DIAGNOSIS — R051 Acute cough: Secondary | ICD-10-CM | POA: Diagnosis not present

## 2024-12-02 DIAGNOSIS — I10 Essential (primary) hypertension: Secondary | ICD-10-CM | POA: Diagnosis not present

## 2024-12-02 DIAGNOSIS — J069 Acute upper respiratory infection, unspecified: Secondary | ICD-10-CM | POA: Diagnosis not present

## 2024-12-02 LAB — POC COVID19/FLU A&B COMBO
Covid Antigen, POC: NEGATIVE
Influenza A Antigen, POC: NEGATIVE
Influenza B Antigen, POC: NEGATIVE

## 2024-12-02 MED ORDER — PROMETHAZINE-DM 6.25-15 MG/5ML PO SYRP: 5.0000 mL | ORAL_SOLUTION | Freq: Four times a day (QID) | ORAL | 0 refills | Status: AC | PRN

## 2024-12-02 NOTE — ED Provider Notes (Signed)
 [facility]    CSN: [id number] Arrival date & time: 12/02/24  1459      History   Chief Complaint Chief Complaint  Patient presents with   Cough   Nasal Congestion    HPI Catherine Pena is a 78 y.o. female presenting for fatigue, cough, congestion, and hoarseness x 2-3 days.  Denies fever, ear pain, sinus pain, chest pain, wheezing, shortness of breath, abdominal pain, vomiting or diarrhea.  Patient has been taking over-the-counter meds and using old prescription of cough medicine. No other complaints.   HPI  Past Medical History:  Diagnosis Date   Glaucoma    Hypertension     Patient Active Problem List   Diagnosis Date Noted   Benign essential HTN 04/14/2015   Adiposity 04/14/2015   Borderline diabetes 04/14/2015    Past Surgical History:  Procedure Laterality Date   CATARACT EXTRACTION, BILATERAL Bilateral 10/2016 and 11/2016   EYE SURGERY      OB History   No obstetric history on file.      Home Medications    Prior to Admission medications   Medication Sig Start Date End Date Taking? Authorizing Provider  albuterol  (VENTOLIN  HFA) 108 (90 Base) MCG/ACT inhaler Inhale 2 puffs into the lungs every 4 (four) hours as needed for wheezing or shortness of breath. 01/01/23   Van Knee, MD  hydrochlorothiazide  (HYDRODIURIL ) 12.5 MG tablet Take 1 tablet (12.5 mg total) by mouth daily. 03/16/24   Joshua Cathryne BROCKS, MD  ibuprofen  (ADVIL ) 600 MG tablet Take 1 tablet (600 mg total) by mouth every 6 (six) hours as needed. 07/15/23   Jones, Deanna C, MD  ipratropium (ATROVENT ) 0.06 % nasal spray Place 2 sprays into both nostrils 4 (four) times daily. 02/28/24   Bernardino Ditch, NP  latanoprost (XALATAN) 0.005 % ophthalmic solution Place 1 drop into both eyes at bedtime. 09/30/17   [provider]  lisinopril  (ZESTRIL ) 10 MG tablet Take 1 tablet (10 mg total) by mouth daily. 03/16/24   Joshua Cathryne BROCKS, MD  loratadine  (CLARITIN ) 10 MG tablet Take 1 tablet  (10 mg total) by mouth daily. 03/10/23   Brimage, Vondra, DO  timolol (TIMOPTIC) 0.25 % ophthalmic solution 1 drop every morning. 06/21/23   [provider]    Family History Family History  Problem Relation Age of Onset   Ovarian cancer Mother    Diabetes Father     Social History Social History   Tobacco Use   Smoking status: Former    Current packs/day: 0.25    Average packs/day: 0.3 packs/day for 20.0 years (5.0 ttl pk-yrs)    Types: Cigarettes   Smokeless tobacco: Never  Vaping Use   Vaping status: Never Used  Substance Use Topics   Alcohol use: No   Drug use: No     Allergies   Patient has no known allergies.   Review of Systems Review of Systems  Constitutional:  Positive for fatigue. Negative for chills, diaphoresis and fever.  HENT:  Positive for congestion, rhinorrhea and voice change. Negative for ear pain, sinus pressure, sinus pain and sore throat.   Respiratory:  Positive for cough. Negative for shortness of breath.   Cardiovascular:  Negative for chest pain.  Gastrointestinal:  Negative for abdominal pain, nausea and vomiting.  Musculoskeletal:  Negative for arthralgias and myalgias.  Skin:  Negative for rash.  Neurological:  Negative for weakness and headaches.  Hematological:  Negative for adenopathy.     Physical Exam Triage Vital  Signs ED Triage Vitals  Encounter Vitals Group     BP      Girls Systolic BP Percentile      Girls Diastolic BP Percentile      Boys Systolic BP Percentile      Boys Diastolic BP Percentile      Pulse      Resp      Temp      Temp src      SpO2      Weight      Height      Head Circumference      Peak Flow      Pain Score      Pain Loc      Pain Education      Exclude from Growth Chart    No data found.  Updated Vital Signs BP (!) 181/95 (BP Location: Left Arm)   Pulse 68   Temp 98.7 F (37.1 C) (Oral)   Resp 16   Wt 198 lb (89.8 kg)   SpO2 97%   BMI 33.99 kg/m     Physical  Exam Vitals and nursing note reviewed.  Constitutional:      General: She is not in acute distress.    Appearance: Normal appearance. She is not ill-appearing or toxic-appearing.  HENT:     Head: Normocephalic and atraumatic.     Nose: Congestion present.     Mouth/Throat:     Mouth: Mucous membranes are moist.     Pharynx: Oropharynx is clear.  Eyes:     General: No scleral icterus.       Right eye: No discharge.        Left eye: No discharge.     Conjunctiva/sclera: Conjunctivae normal.  Cardiovascular:     Rate and Rhythm: Normal rate and regular rhythm.     Heart sounds: Normal heart sounds.  Pulmonary:     Effort: Pulmonary effort is normal. No respiratory distress.     Breath sounds: Normal breath sounds.  Musculoskeletal:     Cervical back: Neck supple.  Skin:    General: Skin is dry.  Neurological:     General: No focal deficit present.     Mental Status: She is alert. Mental status is at baseline.     Motor: No weakness.     Gait: Gait normal.  Psychiatric:        Mood and Affect: Mood normal.        Behavior: Behavior normal.      UC Treatments / Results  Labs (all labs ordered are listed, but only abnormal results are displayed) Labs Reviewed  POC COVID19/FLU A&B COMBO - Normal    EKG   Radiology No results found.  Procedures Procedures (including critical care time)  Medications Ordered in UC Medications - No data to display  Initial Impression / Assessment and Plan / UC Course  I have reviewed the triage vital signs and the nursing notes.  Pertinent labs & imaging results that were available during my care of the patient were reviewed by me and considered in my medical decision making (see chart for details).   78 year old female presents for 2 to 3-day history of cough, congestion, runny nose and voice hoarseness.  No associated fever, chest pain or shortness of breath.  BP elevated 181/95.  Other vitals normal and stable.  Overall  well-appearing.  No acute distress.  On exam has nasal congestion.  Throat clear.  Chest clear.  Heart regular  rate and rhythm.  Blood pressure recheck is 165/97. Patient advised to continue hydrochlorothiazide  and lisinopril  for hypertension.  Advised to follow PCP if consistently greater than 140/90 BP readings.  Advised ED if any severe headaches, vision changes, tingling/numbness, facial drooping or weakness, chest pain, shortness of breath, racing heart, leg swelling.  COVID/flu testing negative.   Viral illness.  Supportive care encouraged.  Sent Promethazine  DM to pharmacy.  She states she had a prescription for this in the past and it was helpful.  Encouraged increasing rest and fluids.  Reviewed typical course of most viral illnesses.  Reviewed return precautions.   Final Clinical Impressions(s) / UC Diagnoses   Final diagnoses:  Acute cough  Viral upper respiratory tract infection  Essential hypertension     Discharge Instructions      -Negative flu and COVID  URI/COLD SYMPTOMS: Your exam today is consistent with a viral illness. Antibiotics are not indicated at this time. Use medications as directed, including cough syrup, nasal saline, and decongestants. Your symptoms should improve over the next few days and resolve within 7-10 days. Increase rest and fluids. F/u if symptoms worsen or predominate such as sore throat, ear pain, productive cough, shortness of breath, or if you develop high fevers or worsening fatigue over the next several days.    BP high today. Continue hydrochlorothiazide  and lisinopril  for hypertension.  Advised to follow PCP if consistently greater than 140/90 BP readings.  Advised ED if any severe headaches, vision changes, tingling/numbness, facial drooping or weakness, chest pain, shortness of breath, racing heart, leg swelling.     ED Prescriptions   None    I have reviewed the PDMP during this encounter.   Arvis Jolan NOVAK, PA-C 12/02/24  [phone]

## 2024-12-02 NOTE — Discharge Instructions (Addendum)
-  Negative flu and COVID  URI/COLD SYMPTOMS: Your exam today is consistent with a viral illness. Antibiotics are not indicated at this time. Use medications as directed, including cough syrup, nasal saline, and decongestants. Your symptoms should improve over the next few days and resolve within 7-10 days. Increase rest and fluids. F/u if symptoms worsen or predominate such as sore throat, ear pain, productive cough, shortness of breath, or if you develop high fevers or worsening fatigue over the next several days.    BP high today. Continue hydrochlorothiazide  and lisinopril  for hypertension.  Advised to follow PCP if consistently greater than 140/90 BP readings.  Advised ED if any severe headaches, vision changes, tingling/numbness, facial drooping or weakness, chest pain, shortness of breath, racing heart, leg swelling.

## 2024-12-02 NOTE — ED Triage Notes (Signed)
 Pt presents with a cough, congestion and hoarse x 3 days. Pt has taken cough syrup she was prescribed in the past. Pt states it helped a little with her symptoms.
